# Patient Record
Sex: Male | Born: 1945
Health system: Southern US, Community
[De-identification: ages and names within clinical notes are randomized; demographics above are authoritative.]

## PROBLEM LIST (undated history)

## (undated) DIAGNOSIS — R972 Elevated prostate specific antigen [PSA]: Secondary | ICD-10-CM

## (undated) DIAGNOSIS — N400 Enlarged prostate without lower urinary tract symptoms: Secondary | ICD-10-CM

## (undated) DIAGNOSIS — N401 Enlarged prostate with lower urinary tract symptoms: Secondary | ICD-10-CM

## (undated) HISTORY — PX: HERNIA REPAIR: SHX51

## (undated) HISTORY — DX: Benign prostatic hyperplasia with lower urinary tract symptoms: N40.1

## (undated) HISTORY — DX: Benign prostatic hyperplasia without lower urinary tract symptoms: N40.0

## (undated) HISTORY — DX: Benign prostatic hyperplasia without lower urinary tract symptoms: R97.20

---

## 1999-09-15 ENCOUNTER — Encounter: Payer: Self-pay | Admitting: General Surgery

## 1999-09-17 ENCOUNTER — Ambulatory Visit (HOSPITAL_COMMUNITY): Admission: RE | Admit: 1999-09-17 | Discharge: 1999-09-17 | Payer: Self-pay | Admitting: Urology

## 2008-09-26 ENCOUNTER — Emergency Department (HOSPITAL_COMMUNITY): Admission: EM | Admit: 2008-09-26 | Discharge: 2008-09-26 | Payer: Self-pay | Admitting: Emergency Medicine

## 2009-03-27 ENCOUNTER — Ambulatory Visit (HOSPITAL_BASED_OUTPATIENT_CLINIC_OR_DEPARTMENT_OTHER): Admission: RE | Admit: 2009-03-27 | Discharge: 2009-03-27 | Payer: Self-pay | Admitting: Surgery

## 2011-01-03 LAB — POCT HEMOGLOBIN-HEMACUE: Hemoglobin: 14.3 g/dL (ref 13.0–17.0)

## 2011-02-09 NOTE — Op Note (Signed)
NAME:  Andrew Hurst, Andrew Hurst                  ACCOUNT NO.:  [id number]   MEDICAL RECORD NO.:  [id number]          PATIENT TYPE:  AMB   LOCATION:  DSC                          FACILITY:  [facility]   PHYSICIAN:  Currie Paris, M.D.DATE OF BIRTH:  [birth year]   DATE OF PROCEDURE:  DATE OF DISCHARGE:                               OPERATIVE REPORT   PREOPERATIVE DIAGNOSIS:  Left inguinal hernia.   POSTOPERATIVE DIAGNOSIS:  Left inguinal hernia - direct.   OPERATION:  Repair of direct left inguinal hernia with mesh.   SURGEON:  Currie Paris, MD   ANESTHESIA:  General.   CLINICAL HISTORY:  This is a 65 year old gentleman with a fairly large  left inguinal hernia that he desired to have repaired.  He has had a  previous right inguinal hernia repair several years ago.   DESCRIPTION OF PROCEDURE:  The patient was seen in the holding area and  he had no further questions.  We had both identified and marked the left  side as the operative side.   The patient was taken to the operating room and after satisfactory  general anesthesia had been obtained, the left inguinal area was  clipped, prepped, and draped.  The time-out was done.   To help with postoperative pain, I injected 0.25% plain Marcaine with  epi along the skin and subcutaneous tissues of the incision and closed  the anterior superior iliac spine below the fascia.   The incision was made and deepened to the external oblique aponeurosis.  The superficial ring was quite dilated.  The fascia was opened and the  cord structures dissected off the undersurface of the fascia and then  lifted up off of the inguinal floor.  There was a large direct defect  with a large amount of preperitoneal fat protruding through and stuck to  the bottom of the cord, which I was able to remove.  Some small vessels  coming out off the epigastric were ligated.  Identified the vas and the  testicular vessels in the cord and preserved those.  I inspected  the  cord closely and there was no evidence of any indirect sac.   Once I had everything cleaned off, I put some 2-0 Prolene to  reapproximate the transversalis and keep the direct defect reduce, so I  could put some mesh in.  I also made sure in doing so that we had a  fairly snug deep ring.   A piece of Marlex mesh was then taped cut, so it was tapered medially  and split laterally to go around the cord and sutured in with a running  2-0 Prolene inferiorly and tacked well up onto the internal oblique  aponeurosis superiorly.  The mesh had been split and was wrapped around  the cord and tacked laterally.  I did not definitively identify the  ilioinguinal nerve, but kept the cord structures together and thought  that it would be contained in those.   I put more Marcaine as we worked.  Everything appeared to be dry, so I  closed with 3-0 Vicryl on  the external oblique and Scarpa's, and for  Monocryl subcuticular and Dermabond on the skin.   The patient tolerated the procedure well and there were no  complications.      Currie Paris, M.D.  Electronically Signed     CJS/MEDQ  D:  03/27/2009  T:  03/28/2009  Job:  [id number]

## 2013-03-13 ENCOUNTER — Ambulatory Visit (INDEPENDENT_AMBULATORY_CARE_PROVIDER_SITE_OTHER): Payer: 59 | Admitting: Internal Medicine

## 2013-03-13 VITALS — BP 156/72 | HR 66 | Temp 98.0°F | Resp 16 | Ht 66.5 in | Wt 170.0 lb

## 2013-03-13 DIAGNOSIS — W57XXXA Bitten or stung by nonvenomous insect and other nonvenomous arthropods, initial encounter: Secondary | ICD-10-CM

## 2013-03-13 MED ORDER — DOXYCYCLINE HYCLATE 100 MG PO TABS: 100.0000 mg | ORAL_TABLET | Freq: Two times a day (BID) | ORAL | Status: DC

## 2013-03-13 NOTE — Progress Notes (Signed)
  Subjective:    Patient ID: Andrew Hurst, male    DOB: [birth year], 67 y.o.   MRN: [id number]  HPI2 ticks removed L buttock 1 week ago Red spots/sl itch No systemic sxtoms  No illn No meds  No real F/U-ongoing care    Review of Systems     Objective:   Physical Exam BP 156/72  Pulse 66  Temp(Src) 98 F (36.7 C) (Oral)  Resp 16  Ht 5' 6.5" (1.689 m)  Wt 170 lb (77.111 kg)  BMI 27.03 kg/m2  SpO2 96% 2 cm lesions x 2 on L buttock No pus or vesic No eryth marg        Assessment & Plan:  Tick bite Given doxy to hold and start if any fever/flu sxt, then f/u asap  Set up appt for CPE-routine care

## 2013-05-31 ENCOUNTER — Ambulatory Visit: Payer: 59

## 2013-05-31 ENCOUNTER — Ambulatory Visit (INDEPENDENT_AMBULATORY_CARE_PROVIDER_SITE_OTHER): Payer: 59 | Admitting: Emergency Medicine

## 2013-05-31 VITALS — BP 114/64 | HR 72 | Temp 97.9°F | Resp 18 | Ht 66.5 in | Wt 196.4 lb

## 2013-05-31 DIAGNOSIS — J020 Streptococcal pharyngitis: Secondary | ICD-10-CM

## 2013-05-31 DIAGNOSIS — R059 Cough, unspecified: Secondary | ICD-10-CM

## 2013-05-31 DIAGNOSIS — R05 Cough: Secondary | ICD-10-CM

## 2013-05-31 DIAGNOSIS — J209 Acute bronchitis, unspecified: Secondary | ICD-10-CM

## 2013-05-31 LAB — POCT CBC
HCT, POC: 42.7 % — AB (ref 43.5–53.7)
Hemoglobin: 13.7 g/dL — AB (ref 14.1–18.1)
Lymph, poc: 2.1 (ref 0.6–3.4)
MCH, POC: 31.7 pg — AB (ref 27–31.2)
MCHC: 32.1 g/dL (ref 31.8–35.4)
MCV: 98.9 fL — AB (ref 80–97)
MPV: 7.6 fL (ref 0–99.8)
POC MID %: 7.7 %M (ref 0–12)
RBC: 4.32 M/uL — AB (ref 4.69–6.13)
WBC: 7.8 10*3/uL (ref 4.6–10.2)

## 2013-05-31 LAB — POCT RAPID STREP A (OFFICE): Rapid Strep A Screen: NEGATIVE

## 2013-05-31 MED ORDER — HYDROCODONE-HOMATROPINE 5-1.5 MG/5ML PO SYRP: ORAL_SOLUTION | ORAL | Status: DC

## 2013-05-31 MED ORDER — AZITHROMYCIN 250 MG PO TABS: ORAL_TABLET | ORAL | Status: DC

## 2013-05-31 MED ORDER — BENZONATATE 100 MG PO CAPS: 100.0000 mg | ORAL_CAPSULE | Freq: Three times a day (TID) | ORAL | Status: DC | PRN

## 2013-05-31 NOTE — Progress Notes (Signed)
  Subjective:    Patient ID: Andrew Hurst, male    DOB: Mar 02, 1946, 67 y.o.   MRN: [id number]  HPI patient states he got ill 1 week ago. He started with head congestion scratchy throat hoarseness. He then developed chest tightness and a full sensation in his head. He denies fever he denies productive cough. He has a history of no ongoing medical problems is in good health and is nonsmoker.    Review of Systems     Objective:   Physical Exam HEENT exam TMs are clear nose is congested the throat is slightly red the uvula is somewhat edematous. The neck is supple chest is clear to both auscultation and percussion without wheezes cardiac exam reveals a regular rate without murmurs.  [facility] reading (PRIMARY) by  Dr. Cleta Alberts their increased bronchial markings in the left base and in the right middle lobe seen best on the lateral.  Results for orders placed in visit on 05/31/13  POCT RAPID STREP A (OFFICE)      Result Value Range   Rapid Strep A Screen Negative  Negative  POCT CBC      Result Value Range   WBC 7.8  4.6 - 10.2 K/uL   Lymph, poc 2.1  0.6 - 3.4   POC LYMPH PERCENT 26.4  10 - 50 %L   MID (cbc) 0.6  0 - 0.9   POC MID % 7.7  0 - 12 %M   POC Granulocyte 5.1  2 - 6.9   Granulocyte percent 65.9  37 - 80 %G   RBC 4.32 (*) 4.69 - 6.13 M/uL   Hemoglobin 13.7 (*) 14.1 - 18.1 g/dL   HCT, POC 40.9 (*) 81.1 - 53.7 %   MCV 98.9 (*) 80 - 97 fL   MCH, POC 31.7 (*) 27 - 31.2 pg   MCHC 32.1  31.8 - 35.4 g/dL   RDW, POC 91.4     Platelet Count, POC 257  142 - 424 K/uL   MPV 7.6  0 - 99.8 fL        Assessment & Plan:  Increased bronchial markings seen on chest x-ray. White count is normal strep test negative will treat with Tessalon Perles cough syrup at night and a Z-Pak.

## 2013-05-31 NOTE — Patient Instructions (Signed)

## 2013-07-17 ENCOUNTER — Encounter: Payer: Self-pay | Admitting: Physician Assistant

## 2013-07-17 ENCOUNTER — Ambulatory Visit (INDEPENDENT_AMBULATORY_CARE_PROVIDER_SITE_OTHER): Payer: 59 | Admitting: Physician Assistant

## 2013-07-17 VITALS — BP 140/70 | HR 56 | Temp 97.8°F | Resp 16 | Ht 66.0 in | Wt 171.4 lb

## 2013-07-17 DIAGNOSIS — R059 Cough, unspecified: Secondary | ICD-10-CM

## 2013-07-17 DIAGNOSIS — Z1211 Encounter for screening for malignant neoplasm of colon: Secondary | ICD-10-CM

## 2013-07-17 DIAGNOSIS — Z1159 Encounter for screening for other viral diseases: Secondary | ICD-10-CM

## 2013-07-17 DIAGNOSIS — Z23 Encounter for immunization: Secondary | ICD-10-CM

## 2013-07-17 DIAGNOSIS — Z0000 Encounter for general adult medical examination without abnormal findings: Secondary | ICD-10-CM

## 2013-07-17 DIAGNOSIS — R05 Cough: Secondary | ICD-10-CM

## 2013-07-17 DIAGNOSIS — Z125 Encounter for screening for malignant neoplasm of prostate: Secondary | ICD-10-CM

## 2013-07-17 LAB — POCT URINALYSIS DIPSTICK
Bilirubin, UA: NEGATIVE
Blood, UA: NEGATIVE
Glucose, UA: NEGATIVE
Ketones, UA: NEGATIVE
Leukocytes, UA: NEGATIVE
Protein, UA: NEGATIVE
Spec Grav, UA: 1.015
Urobilinogen, UA: 0.2

## 2013-07-17 LAB — CBC WITH DIFFERENTIAL/PLATELET
Basophils Relative: 1 % (ref 0–1)
Eosinophils Relative: 4 % (ref 0–5)
HCT: 42.2 % (ref 39.0–52.0)
Hemoglobin: 14.7 g/dL (ref 13.0–17.0)
MCHC: 34.8 g/dL (ref 30.0–36.0)
MCV: 90.2 fL (ref 78.0–100.0)
Monocytes Absolute: 0.5 10*3/uL (ref 0.1–1.0)
Monocytes Relative: 9 % (ref 3–12)
Neutro Abs: 3 10*3/uL (ref 1.7–7.7)
RDW: 13.4 % (ref 11.5–15.5)

## 2013-07-17 LAB — POCT UA - MICROSCOPIC ONLY
Bacteria, U Microscopic: NEGATIVE
Casts, Ur, LPF, POC: NEGATIVE
Crystals, Ur, HPF, POC: NEGATIVE
Mucus, UA: NEGATIVE

## 2013-07-17 LAB — LIPID PANEL
HDL: 44 mg/dL (ref >39)
Total CHOL/HDL Ratio: 3.9 Ratio
Triglycerides: 157 mg/dL — ABNORMAL HIGH (ref <150)

## 2013-07-17 LAB — COMPREHENSIVE METABOLIC PANEL
Albumin: 3.9 g/dL (ref 3.5–5.2)
Alkaline Phosphatase: 51 U/L (ref 39–117)
BUN: 16 mg/dL (ref 6–23)
Calcium: 8.8 mg/dL (ref 8.4–10.5)
Chloride: 106 mEq/L (ref 96–112)
Creat: 1.05 mg/dL (ref 0.50–1.35)
Glucose, Bld: 112 mg/dL — ABNORMAL HIGH (ref 70–99)
Potassium: 4 mEq/L (ref 3.5–5.3)

## 2013-07-17 LAB — IFOBT (OCCULT BLOOD): IFOBT: NEGATIVE

## 2013-07-17 LAB — HEPATITIS C ANTIBODY: HCV Ab: NEGATIVE

## 2013-07-17 MED ORDER — ZOSTER VACCINE LIVE 19400 UNT/0.65ML ~~LOC~~ SOLR: 0.6500 mL | Freq: Once | SUBCUTANEOUS | Status: DC

## 2013-07-17 MED ORDER — IPRATROPIUM BROMIDE 0.03 % NA SOLN: 2.0000 | Freq: Two times a day (BID) | NASAL | Status: DC

## 2013-07-17 NOTE — Progress Notes (Signed)
Subjective:    Patient ID: Andrew Hurst, male    DOB: 1945/10/11, 67 y.o.   MRN: [id number]  HPI  This 67 y.o. male presents for Annual Wellness Exam.    Active Ambulatory Problems    Diagnosis Date Noted  . No Active Ambulatory Problems   Resolved Ambulatory Problems    Diagnosis Date Noted  . No Resolved Ambulatory Problems   No Additional Past Medical History    Past Surgical History  Procedure Laterality Date  . Hernia repair      No Known Allergies  Prior to Admission medications   Medication Sig Start Date End Date Taking? Authorizing Provider  ipratropium (ATROVENT) 0.03 % nasal spray Place 2 sprays into the nose 2 (two) times daily. 07/17/13   Tenea Sens S Glynn Freas, PA-C  zoster vaccine live, PF, (ZOSTAVAX) 40981 UNT/0.65ML injection Inject 19,400 Units into the skin once. 07/17/13   Fernande Bras, PA-C    History   Social History  . Marital Status: Widowed    Spouse Name: n/a    Number of Children: 2  . Years of Education: N/A   Occupational History  .  [employer]   Social History Main Topics  . Smoking status: Never Smoker   . Smokeless tobacco: Never Used  . Alcohol Use: No  . Drug Use: No  . Sexual Activity: Yes    Partners: Female    Birth Control/ Protection: Condom     Comment: "not that much"   Other Topics Concern  . None   Social History Narrative   Widowed. Wife died in 61 of metastatic breast cancer at age 69 years. Education: college. Pt. Does exercise.    family history includes COPD in his father; Cancer (age of onset: 88) in his brother; Kidney disease in his mother. indicated that his mother is deceased. He indicated that his father is deceased. He indicated that his brother is deceased. He indicated that his maternal grandmother is deceased. He indicated that his maternal grandfather is deceased. He indicated that his paternal grandmother is deceased. He indicated that his paternal grandfather is deceased. He indicated  that both of his sons are alive.   Review of Systems  Constitutional: Negative.   HENT: Negative.   Eyes: Negative.   Respiratory: Positive for cough (x 4-6 weeks, post-nasal draiange.  Coworkers with similar prolonged cough after URI-type illness.).   Cardiovascular: Negative.   Gastrointestinal: Negative.   Endocrine: Negative.   Genitourinary: Negative.   Musculoskeletal: Negative.   Skin: Negative.   Allergic/Immunologic: Negative.   Neurological: Negative.   Hematological: Negative.   Psychiatric/Behavioral: Negative.        Objective:   Physical Exam  Vitals reviewed. Constitutional: He is oriented to person, place, and time. Vital signs are normal. He appears well-developed and well-nourished. He is active and cooperative.  Non-toxic appearance. He does not have a sickly appearance. He does not appear ill. No distress.  HENT:  Head: Normocephalic and atraumatic.  Right Ear: Hearing, tympanic membrane, external ear and ear canal normal.  Left Ear: Hearing, tympanic membrane, external ear and ear canal normal.  Nose: Nose normal.  Mouth/Throat: Uvula is midline, oropharynx is clear and moist and mucous membranes are normal. He does not have dentures. No oral lesions. No trismus in the jaw. Normal dentition. No dental abscesses, uvula swelling, lacerations or dental caries.  Eyes: Conjunctivae, EOM and lids are normal. Pupils are equal, round, and reactive to light. Right eye exhibits no discharge. Left  eye exhibits no discharge. No scleral icterus.  Fundoscopic exam:      The right eye shows no arteriolar narrowing, no AV nicking, no exudate, no hemorrhage and no papilledema.       The left eye shows no arteriolar narrowing, no AV nicking, no exudate, no hemorrhage and no papilledema.  Neck: Normal range of motion, full passive range of motion without pain and phonation normal. Neck supple. No spinous process tenderness and no muscular tenderness present. No rigidity. No  tracheal deviation, no edema, no erythema and normal range of motion present. No thyromegaly present.  Cardiovascular: Normal rate, regular rhythm, S1 normal, S2 normal, normal heart sounds, intact distal pulses and normal pulses.  Exam reveals no gallop and no friction rub.   No murmur heard. Pulmonary/Chest: Effort normal and breath sounds normal. No respiratory distress. He has no wheezes. He has no rales.  Abdominal: Soft. Normal appearance and bowel sounds are normal. He exhibits no distension and no mass. There is no hepatosplenomegaly. There is no tenderness. There is no rebound and no guarding. No hernia. Hernia confirmed negative in the right inguinal area and confirmed negative in the left inguinal area.  Genitourinary: Rectum normal, prostate normal, testes normal and penis normal. Guaiac negative stool. Circumcised. No phimosis, paraphimosis, hypospadias, penile erythema or penile tenderness. No discharge found.  Musculoskeletal: Normal range of motion. He exhibits no edema and no tenderness.       Right shoulder: Normal.       Left shoulder: Normal.       Right elbow: Normal.      Left elbow: Normal.       Right wrist: Normal.       Left wrist: Normal.       Right hip: Normal.       Left hip: Normal.       Right knee: Normal.       Left knee: Normal.       Right ankle: Normal. Achilles tendon normal.       Left ankle: Normal. Achilles tendon normal.       Cervical back: Normal. He exhibits normal range of motion, no tenderness, no bony tenderness, no swelling, no edema, no deformity, no laceration, no pain, no spasm and normal pulse.       Thoracic back: Normal.       Lumbar back: Normal.       Right upper arm: Normal.       Left upper arm: Normal.       Right forearm: Normal.       Left forearm: Normal.       Right hand: Normal.       Left hand: Normal.       Right upper leg: Normal.       Left upper leg: Normal.       Right lower leg: Normal.       Left lower leg:  Normal.       Right foot: Normal.       Left foot: Normal.  Lymphadenopathy:       Head (right side): No submental, no submandibular, no tonsillar, no preauricular, no posterior auricular and no occipital adenopathy present.       Head (left side): No submental, no submandibular, no tonsillar, no preauricular, no posterior auricular and no occipital adenopathy present.    He has no cervical adenopathy.       Right: No inguinal and no supraclavicular adenopathy present.  Left: No inguinal and no supraclavicular adenopathy present.  Neurological: He is alert and oriented to person, place, and time. He has normal strength and normal reflexes. He displays no tremor. No cranial nerve deficit. He exhibits normal muscle tone. Coordination and gait normal.  Skin: Skin is warm, dry and intact. No abrasion, no ecchymosis, no laceration, no lesion and no rash noted. He is not diaphoretic. No cyanosis or erythema. No pallor. Nails show no clubbing.  Psychiatric: He has a normal mood and affect. His speech is normal and behavior is normal. Judgment and thought content normal. Cognition and memory are normal.   Results for orders placed in visit on 07/17/13  POCT UA - MICROSCOPIC ONLY      Result Value Range   WBC, Ur, HPF, POC 0     RBC, urine, microscopic 0     Bacteria, U Microscopic neg     Mucus, UA neg     Epithelial cells, urine per micros 0     Crystals, Ur, HPF, POC neg     Casts, Ur, LPF, POC neg     Yeast, UA neg    POCT URINALYSIS DIPSTICK      Result Value Range   Color, UA yellow     Clarity, UA clear     Glucose, UA neg     Bilirubin, UA neg     Ketones, UA nege     Spec Grav, UA 1.015     Blood, UA neg     pH, UA 7.0     Protein, UA neg     Urobilinogen, UA 0.2     Nitrite, UA neg     Leukocytes, UA Negative    IFOBT (OCCULT BLOOD)      Result Value Range   IFOBT Negative            Assessment & Plan:  Routine general medical examination at a health care  facility - Plan: CBC with Differential, Comprehensive metabolic panel, Lipid panel, TSH, POCT UA - Microscopic Only, POCT urinalysis dipstick; Age appropriate anticipatory guidance provided.  Screening for colon cancer - Plan: IFOBT POC (occult bld, rslt in office). Colonoscopy current.  Screening for prostate cancer - Plan: PSA  Cough - suspect persistent post-nasal drainage as cause.  If persists, re-evaluate. Plan: ipratropium (ATROVENT) 0.03 % nasal spray  Need for hepatitis C screening test - Plan: Hepatitis C antibody  Need for influenza vaccination - Plan: CANCELED: Flu Vaccine QUAD 36+ mos IM; patient declined vaccination here and indicates he will receive it at work.  Need for pneumococcal vaccination - Plan: Pneumococcal polysaccharide vaccine 23-valent greater than or equal to 2yo subcutaneous/IM  Need for shingles vaccine - Plan: zoster vaccine live, PF, (ZOSTAVAX) 16109 UNT/0.65ML injection  Fernande Bras, PA-C Physician Assistant-Certified [facility]

## 2013-07-17 NOTE — Patient Instructions (Signed)

## 2013-07-19 ENCOUNTER — Encounter: Payer: Self-pay | Admitting: Physician Assistant

## 2013-10-12 ENCOUNTER — Ambulatory Visit (INDEPENDENT_AMBULATORY_CARE_PROVIDER_SITE_OTHER): Payer: 59 | Admitting: Internal Medicine

## 2013-10-12 ENCOUNTER — Ambulatory Visit: Payer: 59

## 2013-10-12 VITALS — BP 128/62 | HR 74 | Temp 97.4°F | Resp 18 | Ht 67.0 in | Wt 176.0 lb

## 2013-10-12 DIAGNOSIS — R05 Cough: Secondary | ICD-10-CM

## 2013-10-12 DIAGNOSIS — J329 Chronic sinusitis, unspecified: Secondary | ICD-10-CM

## 2013-10-12 DIAGNOSIS — R972 Elevated prostate specific antigen [PSA]: Secondary | ICD-10-CM

## 2013-10-12 DIAGNOSIS — R5383 Other fatigue: Secondary | ICD-10-CM

## 2013-10-12 DIAGNOSIS — R059 Cough, unspecified: Secondary | ICD-10-CM

## 2013-10-12 DIAGNOSIS — R5381 Other malaise: Secondary | ICD-10-CM

## 2013-10-12 LAB — POCT CBC
GRANULOCYTE PERCENT: 66.2 % (ref 37–80)
HCT, POC: 47.8 % (ref 43.5–53.7)
Hemoglobin: 15.1 g/dL (ref 14.1–18.1)
LYMPH, POC: 1.8 (ref 0.6–3.4)
MCH, POC: 31 pg (ref 27–31.2)
MCHC: 31.6 g/dL — AB (ref 31.8–35.4)
MCV: 98.1 fL — AB (ref 80–97)
MID (cbc): 0.7 (ref 0–0.9)
MPV: 7.7 fL (ref 0–99.8)
PLATELET COUNT, POC: 215 10*3/uL (ref 142–424)
POC GRANULOCYTE: 4.8 (ref 2–6.9)
POC LYMPH %: 24.5 % (ref 10–50)
POC MID %: 9.3 %M (ref 0–12)
RBC: 4.87 M/uL (ref 4.69–6.13)
RDW, POC: 13.9 %
WBC: 7.3 10*3/uL (ref 4.6–10.2)

## 2013-10-12 LAB — PSA: PSA: 6.65 ng/mL — AB (ref <=4.00)

## 2013-10-12 MED ORDER — AZITHROMYCIN 500 MG PO TABS: 500.0000 mg | ORAL_TABLET | Freq: Every day | ORAL | Status: DC

## 2013-10-12 MED ORDER — HYDROCODONE-ACETAMINOPHEN 7.5-325 MG/15ML PO SOLN: 10.0000 mL | Freq: Four times a day (QID) | ORAL | Status: DC | PRN

## 2013-10-12 NOTE — Progress Notes (Signed)
   Subjective:    Patient ID: Andrew Hurst, male    DOB: [birth year], 68 y.o.   MRN: [id number]  HPI    Review of Systems     Objective:   Physical Exam        Assessment & Plan:

## 2013-10-12 NOTE — Patient Instructions (Addendum)
Sinusitis Sinusitis is redness, soreness, and swelling (inflammation) of the paranasal sinuses. Paranasal sinuses are air pockets within the bones of your face (beneath the eyes, the middle of the forehead, or above the eyes). In healthy paranasal sinuses, mucus is able to drain out, and air is able to circulate through them by way of your nose. However, when your paranasal sinuses are inflamed, mucus and air can become trapped. This can allow bacteria and other germs to grow and cause infection. Sinusitis can develop quickly and last only a short time (acute) or continue over a long period (chronic). Sinusitis that lasts for more than 12 weeks is considered chronic.  CAUSES  Causes of sinusitis include:  Allergies.  Structural abnormalities, such as displacement of the cartilage that separates your nostrils (deviated septum), which can decrease the air flow through your nose and sinuses and affect sinus drainage.  Functional abnormalities, such as when the small hairs (cilia) that line your sinuses and help remove mucus do not work properly or are not present. SYMPTOMS  Symptoms of acute and chronic sinusitis are the same. The primary symptoms are pain and pressure around the affected sinuses. Other symptoms include:  Upper toothache.  Earache.  Headache.  Bad breath.  Decreased sense of smell and taste.  A cough, which worsens when you are lying flat.  Fatigue.  Fever.  Thick drainage from your nose, which often is green and may contain pus (purulent).  Swelling and warmth over the affected sinuses. DIAGNOSIS  Your caregiver will perform a physical exam. During the exam, your caregiver may:  Look in your nose for signs of abnormal growths in your nostrils (nasal polyps).  Tap over the affected sinus to check for signs of infection.  View the inside of your sinuses (endoscopy) with a special imaging device with a light attached (endoscope), which is inserted into your  sinuses. If your caregiver suspects that you have chronic sinusitis, one or more of the following tests may be recommended:  Allergy tests.  Nasal culture A sample of mucus is taken from your nose and sent to a lab and screened for bacteria.  Nasal cytology A sample of mucus is taken from your nose and examined by your caregiver to determine if your sinusitis is related to an allergy. TREATMENT  Most cases of acute sinusitis are related to a viral infection and will resolve on their own within 10 days. Sometimes medicines are prescribed to help relieve symptoms (pain medicine, decongestants, nasal steroid sprays, or saline sprays).  However, for sinusitis related to a bacterial infection, your caregiver will prescribe antibiotic medicines. These are medicines that will help kill the bacteria causing the infection.  Rarely, sinusitis is caused by a fungal infection. In theses cases, your caregiver will prescribe antifungal medicine. For some cases of chronic sinusitis, surgery is needed. Generally, these are cases in which sinusitis recurs more than 3 times per year, despite other treatments. HOME CARE INSTRUCTIONS   Drink plenty of water. Water helps thin the mucus so your sinuses can drain more easily.  Use a humidifier.  Inhale steam 3 to 4 times a day (for example, sit in the bathroom with the shower running).  Apply a warm, moist washcloth to your face 3 to 4 times a day, or as directed by your caregiver.  Use saline nasal sprays to help moisten and clean your sinuses.  Take over-the-counter or prescription medicines for pain, discomfort, or fever only as directed by your caregiver. SEEK IMMEDIATE MEDICAL   CARE IF:  You have increasing pain or severe headaches.  You have nausea, vomiting, or drowsiness.  You have swelling around your face.  You have vision problems.  You have a stiff neck.  You have difficulty breathing. MAKE SURE YOU:   Understand these  instructions.  Will watch your condition.  Will get help right away if you are not doing well or get worse. Document Released: 09/13/2005 Document Revised: 12/06/2011 Document Reviewed: 09/28/2011 [facility] Patient Information 2014 [place], [place]. Prostate-Specific Antigen The prostate-specific antigen (PSA) is a blood test. It is used to help detect early forms of prostate cancer. The test is usually used along with other tests. The test is also used to follow the course of those who already have prostate cancer or who have been treated for prostate cancer. Some factors interfere with the results of the PSA. The factors listed below will either increase or decrease the PSA levels. They are:  Prescriptions used for male baldness.  Some herbs.  Active prostate infection.  Prior instrumentation or urinary catheterization.  Ejaculation up to 2 days prior to testing.  A noncancerous enlargement of the prostate.  Inflammation of the prostate.  Active urinary tract infection. If your test results are elevated, your caregiver will discuss the results with you. Your caregiver will also let you know if more evaluation is needed. PREPARATION FOR TEST No preparation or fasting is necessary. NORMAL FINDINGS Less than 4 ng/mL or Less than 404mcg/L (SI units) Ranges for normal findings may vary among different laboratories and hospitals. You should always check with your caregiver after having lab work or other tests done to discuss the meaning of your test results and whether your values are considered within normal limits. MEANING OF TEST  A normal value means prostate cancer is less likely. The chance of having prostate cancer increases if the value is between 4 ng/mL and 10 ng/mL. However, further testing will be needed. Values above 10 ng/mL indicate that there is a much higher chance of having prostate cancer (if the above situations that raise PSA are not present). Your caregiver will go  over your test results with you and discuss the importance of this test. If this value is elevated, your caregiver may recommend further testing or evaluation. OBTAINING THE TEST RESULTS It is your responsibility to obtain your test results. Ask the lab or department performing the test when and how you will get your results. Document Released: 10/16/2004 Document Revised: 12/06/2011 Document Reviewed: 04/21/2007 [facility] Patient Information 2014 [place], [place].

## 2013-10-12 NOTE — Progress Notes (Signed)
   Subjective:    Patient ID: Andrew Hurst, male    DOB: 07-16-1946, 68 y.o.   MRN: [id number]  HPI Patient presents with a three day history of slightly productive cough sinus pressure and sinus congestion some pressure around the eyes.  Patient denies fever, sore throat, nausea, vomiting, or diarrhea.  Elevated PSA 5.38 found on chart review. Review of Systems     Objective:   Physical Exam  Vitals reviewed. Constitutional: He is oriented to person, place, and time. He appears well-developed and well-nourished. No distress.  HENT:  Head: Normocephalic.  Right Ear: External ear normal.  Left Ear: External ear normal.  Nose: Mucosal edema, rhinorrhea and sinus tenderness present. No epistaxis. Right sinus exhibits maxillary sinus tenderness and frontal sinus tenderness. Left sinus exhibits maxillary sinus tenderness and frontal sinus tenderness.  Mouth/Throat: Oropharynx is clear and moist.  Eyes: Conjunctivae and EOM are normal. Pupils are equal, round, and reactive to light.  Neck: Normal range of motion. Neck supple. No tracheal deviation present. No thyromegaly present.  Cardiovascular: Normal rate, regular rhythm and normal heart sounds.   Pulmonary/Chest: Bradypnea noted. No respiratory distress. He has no decreased breath sounds. He has wheezes. He has rhonchi. He has no rales.  Lymphadenopathy:    He has no cervical adenopathy.  Neurological: He is alert and oriented to person, place, and time. He exhibits normal muscle tone. Coordination normal.  Psychiatric: He has a normal mood and affect.    [facility] reading (PRIMARY) by  Dr.Oretha Weismann. Possible left lower lobe infiltrate   Results for orders placed in visit on 10/12/13  POCT CBC      Result Value Range   WBC 7.3  4.6 - 10.2 K/uL   Lymph, poc 1.8  0.6 - 3.4   POC LYMPH PERCENT 24.5  10 - 50 %L   MID (cbc) 0.7  0 - 0.9   POC MID % 9.3  0 - 12 %M   POC Granulocyte 4.8  2 - 6.9   Granulocyte percent 66.2  37 - 80 %G   RBC 4.87  4.69 - 6.13 M/uL   Hemoglobin 15.1  14.1 - 18.1 g/dL   HCT, POC 62.947.8  52.843.5 - 53.7 %   MCV 98.1 (*) 80 - 97 fL   MCH, POC 31.0  27 - 31.2 pg   MCHC 31.6 (*) 31.8 - 35.4 g/dL   RDW, POC 41.313.9     Platelet Count, POC 215  142 - 424 K/uL   MPV 7.7  0 - 99.8 fL        Assessment & Plan:  Sinusitis/Cough/Bronchitis High PSA from 07/19/13 cpe/Repeat PSA

## 2013-10-29 DIAGNOSIS — R972 Elevated prostate specific antigen [PSA]: Secondary | ICD-10-CM | POA: Insufficient documentation

## 2014-03-19 ENCOUNTER — Ambulatory Visit (INDEPENDENT_AMBULATORY_CARE_PROVIDER_SITE_OTHER): Payer: 59 | Admitting: Family Medicine

## 2014-03-19 VITALS — BP 118/72 | HR 64 | Temp 98.2°F | Resp 18 | Ht 67.0 in | Wt 170.0 lb

## 2014-03-19 DIAGNOSIS — M25579 Pain in unspecified ankle and joints of unspecified foot: Secondary | ICD-10-CM

## 2014-03-19 DIAGNOSIS — M25572 Pain in left ankle and joints of left foot: Secondary | ICD-10-CM

## 2014-03-19 DIAGNOSIS — H9190 Unspecified hearing loss, unspecified ear: Secondary | ICD-10-CM

## 2014-03-19 DIAGNOSIS — M25571 Pain in right ankle and joints of right foot: Secondary | ICD-10-CM

## 2014-03-19 DIAGNOSIS — H9192 Unspecified hearing loss, left ear: Secondary | ICD-10-CM

## 2014-03-19 DIAGNOSIS — M722 Plantar fascial fibromatosis: Secondary | ICD-10-CM

## 2014-03-19 DIAGNOSIS — M25569 Pain in unspecified knee: Secondary | ICD-10-CM

## 2014-03-19 DIAGNOSIS — M25561 Pain in right knee: Secondary | ICD-10-CM

## 2014-03-19 NOTE — Patient Instructions (Signed)
Plantar Fasciitis (Heel Spur Syndrome) with Rehab The plantar fascia is a fibrous, ligament-like, soft-tissue structure that spans the bottom of the foot. Plantar fasciitis is a condition that causes pain in the foot due to inflammation of the tissue. SYMPTOMS   Pain and tenderness on the underneath side of the foot.  Pain that worsens with standing or walking. CAUSES  Plantar fasciitis is caused by irritation and injury to the plantar fascia on the underneath side of the foot. Common mechanisms of injury include:  Direct trauma to bottom of the foot.  Damage to a small nerve that runs under the foot where the main fascia attaches to the heel bone.  Stress placed on the plantar fascia due to bone spurs. RISK INCREASES WITH:   Activities that place stress on the plantar fascia (running, jumping, pivoting, or cutting).  Poor strength and flexibility.  Improperly fitted shoes.  Tight calf muscles.  Flat feet.  Failure to warm-up properly before activity.  Obesity. PREVENTION  Warm up and stretch properly before activity.  Allow for adequate recovery between workouts.  Maintain physical fitness:  Strength, flexibility, and endurance.  Cardiovascular fitness.  Maintain a health body weight.  Avoid stress on the plantar fascia.  Wear properly fitted shoes, including arch supports for individuals who have flat feet. PROGNOSIS  If treated properly, then the symptoms of plantar fasciitis usually resolve without surgery. However, occasionally surgery is necessary. RELATED COMPLICATIONS   Recurrent symptoms that may result in a chronic condition.  Problems of the lower back that are caused by compensating for the injury, such as limping.  Pain or weakness of the foot during push-off following surgery.  Chronic inflammation, scarring, and partial or complete fascia tear, occurring more often from repeated injections. TREATMENT  Treatment initially involves the use of  ice and medication to help reduce pain and inflammation. The use of strengthening and stretching exercises may help reduce pain with activity, especially stretches of the Achilles tendon. These exercises may be performed at home or with a therapist. Your caregiver may recommend that you use heel cups of arch supports to help reduce stress on the plantar fascia. Occasionally, corticosteroid injections are given to reduce inflammation. If symptoms persist for greater than 6 months despite non-surgical (conservative), then surgery may be recommended.  MEDICATION   If pain medication is necessary, then nonsteroidal anti-inflammatory medications, such as aspirin and ibuprofen, or other minor pain relievers, such as acetaminophen, are often recommended.  Do not take pain medication within 7 days before surgery.  Prescription pain relievers may be given if deemed necessary by your caregiver. Use only as directed and only as much as you need.  Corticosteroid injections may be given by your caregiver. These injections should be reserved for the most serious cases, because they may only be given a certain number of times. HEAT AND COLD  Cold treatment (icing) relieves pain and reduces inflammation. Cold treatment should be applied for 10 to 15 minutes every 2 to 3 hours for inflammation and pain and immediately after any activity that aggravates your symptoms. Use ice packs or massage the area with a piece of ice (ice massage).  Heat treatment may be used prior to performing the stretching and strengthening activities prescribed by your caregiver, physical therapist, or athletic trainer. Use a heat pack or soak the injury in warm water. SEEK IMMEDIATE MEDICAL CARE IF:  Treatment seems to offer no benefit, or the condition worsens.  Any medications produce adverse side effects. EXERCISES RANGE   OF MOTION (ROM) AND STRETCHING EXERCISES - Plantar Fasciitis (Heel Spur Syndrome) These exercises may help you  when beginning to rehabilitate your injury. Your symptoms may resolve with or without further involvement from your physician, physical therapist or athletic trainer. While completing these exercises, remember:   Restoring tissue flexibility helps normal motion to return to the joints. This allows healthier, less painful movement and activity.  An effective stretch should be held for at least 30 seconds.  A stretch should never be painful. You should only feel a gentle lengthening or release in the stretched tissue. RANGE OF MOTION - Toe Extension, Flexion  Sit with your right / left leg crossed over your opposite knee.  Grasp your toes and gently pull them back toward the top of your foot. You should feel a stretch on the bottom of your toes and/or foot.  Hold this stretch for __________ seconds.  Now, gently pull your toes toward the bottom of your foot. You should feel a stretch on the top of your toes and or foot.  Hold this stretch for __________ seconds. Repeat __________ times. Complete this stretch __________ times per day.  RANGE OF MOTION - Ankle Dorsiflexion, Active Assisted  Remove shoes and sit on a chair that is preferably not on a carpeted surface.  Place right / left foot under knee. Extend your opposite leg for support.  Keeping your heel down, slide your right / left foot back toward the chair until you feel a stretch at your ankle or calf. If you do not feel a stretch, slide your bottom forward to the edge of the chair, while still keeping your heel down.  Hold this stretch for __________ seconds. Repeat __________ times. Complete this stretch __________ times per day.  STRETCH - Gastroc, Standing  Place hands on wall.  Extend right / left leg, keeping the front knee somewhat bent.  Slightly point your toes inward on your back foot.  Keeping your right / left heel on the floor and your knee straight, shift your weight toward the wall, not allowing your back to  arch.  You should feel a gentle stretch in the right / left calf. Hold this position for __________ seconds. Repeat __________ times. Complete this stretch __________ times per day. STRETCH - Soleus, Standing  Place hands on wall.  Extend right / left leg, keeping the other knee somewhat bent.  Slightly point your toes inward on your back foot.  Keep your right / left heel on the floor, bend your back knee, and slightly shift your weight over the back leg so that you feel a gentle stretch deep in your back calf.  Hold this position for __________ seconds. Repeat __________ times. Complete this stretch __________ times per day. STRETCH - Gastrocsoleus, Standing  Note: This exercise can place a lot of stress on your foot and ankle. Please complete this exercise only if specifically instructed by your caregiver.   Place the ball of your right / left foot on a step, keeping your other foot firmly on the same step.  Hold on to the wall or a rail for balance.  Slowly lift your other foot, allowing your body weight to press your heel down over the edge of the step.  You should feel a stretch in your right / left calf.  Hold this position for __________ seconds.  Repeat this exercise with a slight bend in your right / left knee. Repeat __________ times. Complete this stretch __________ times per day.    STRENGTHENING EXERCISES - Plantar Fasciitis (Heel Spur Syndrome)  These exercises may help you when beginning to rehabilitate your injury. They may resolve your symptoms with or without further involvement from your physician, physical therapist or athletic trainer. While completing these exercises, remember:   Muscles can gain both the endurance and the strength needed for everyday activities through controlled exercises.  Complete these exercises as instructed by your physician, physical therapist or athletic trainer. Progress the resistance and repetitions only as guided. STRENGTH -  Towel Curls  Sit in a chair positioned on a non-carpeted surface.  Place your foot on a towel, keeping your heel on the floor.  Pull the towel toward your heel by only curling your toes. Keep your heel on the floor.  If instructed by your physician, physical therapist or athletic trainer, add ____________________ at the end of the towel. Repeat __________ times. Complete this exercise __________ times per day. STRENGTH - Ankle Inversion  Secure one end of a rubber exercise band/tubing to a fixed object (table, pole). Loop the other end around your foot just before your toes.  Place your fists between your knees. This will focus your strengthening at your ankle.  Slowly, pull your big toe up and in, making sure the band/tubing is positioned to resist the entire motion.  Hold this position for __________ seconds.  Have your muscles resist the band/tubing as it slowly pulls your foot back to the starting position. Repeat __________ times. Complete this exercises __________ times per day.  Document Released: 09/13/2005 Document Revised: 12/06/2011 Document Reviewed: 12/26/2008 [employer] Patient Information 2015 [employer]. This information is not intended to replace advice given to you by your health care Muzammil Bruins. Make sure you discuss any questions you have with your health care Morning Halberg.  

## 2014-03-19 NOTE — Progress Notes (Signed)
Chief Complaint:  Chief Complaint  Patient presents with  . Foot Pain    bottom of left foot   . Leg Pain    rt-swelling after standing all day   . Hearing Loss    after shooting guns friday he did have ear plugs     HPI: Andrew Hurst is a 68 y.o. male who is here for : 1. Left foot/leg pain bilaterally x 1 year , starts at bottom of his feet at  Heel, he wears good shoes but worse as day goes on due to long hours on concrete floors, has not done anything for this 2. Hearing loss after he went to the shooting range 2 days ago, no prior problems with hearing, was wearing ear plugs  History reviewed. No pertinent past medical history. Past Surgical History  Procedure Laterality Date  . Hernia repair     History   Social History  . Marital Status: Widowed    Spouse Name: n/a    Number of Children: 2  . Years of Education: N/A   Occupational History  .  [employer]   Social History Main Topics  . Smoking status: Never Smoker   . Smokeless tobacco: Never Used  . Alcohol Use: No  . Drug Use: No  . Sexual Activity: Yes    Partners: Female    Birth Control/ Protection: Condom     Comment: "not that much"   Other Topics Concern  . None   Social History Narrative   Widowed. Wife died in [id number] of metastatic breast cancer at age 68 years. Education: college. Pt. Does exercise.   Family History  Problem Relation Age of Onset  . Kidney disease Mother   . COPD Father   . Cancer Brother [id number]    colon cancer   No Known Allergies Prior to Admission medications   Medication Sig Start Date End Date Taking? Authorizing Kesley Gaffey  azithromycin (ZITHROMAX) 500 MG tablet Take 1 tablet (500 mg total) by mouth daily. 10/12/13   Jonita Albeehris W Guest, MD  HYDROcodone-acetaminophen (HYCET) 7.5-325 mg/15 ml solution Take 10-15 mLs by mouth every 6 (six) hours as needed. 10/12/13   Jonita Albeehris W Guest, MD  ipratropium (ATROVENT) 0.03 % nasal spray Place 2 sprays into the nose 2 (two) times  daily. 07/17/13   [name] S Jeffery, PA-C  zoster vaccine live, PF, (ZOSTAVAX) [id number] UNT/0.65ML injection Inject 19,400 Units into the skin once. 07/17/13   [name] S Jeffery, PA-C     ROS: The patient denies fevers, chills, night sweats, unintentional weight loss, chest pain, palpitations, wheezing, dyspnea on exertion, nausea, vomiting, abdominal pain, dysuria, hematuria, melena, numbness, weakness, or tingling.   All other systems have been reviewed and were otherwise negative with the exception of those mentioned in the HPI and as above.    PHYSICAL EXAM: Filed Vitals:   03/19/14 1200  BP: 118/72  Pulse: 64  Temp: 98.2 F (36.8 C)  Resp: 18   Filed Vitals:   03/19/14 1200  Height: 5\' 7"  (1.702 m)  Weight: 170 lb (77.111 kg)   Body mass index is 26.62 kg/(m^2).  General: Alert, no acute distress HEENT:  Normocephalic, atraumatic, oropharynx patent. EOMI, PERRLA, TM normal, no wax Cardiovascular:  Regular rate and rhythm, no rubs murmurs or gallops.  No Carotid bruits, radial pulse intact. No pedal edema.  Respiratory: Clear to auscultation bilaterally.  No wheezes, rales, or rhonchi.  No cyanosis, no use of accessory musculature GI:  No organomegaly, abdomen is soft and non-tender, positive bowel sounds.  No masses. Skin: No rashes. Neurologic: Facial musculature symmetric. Psychiatric: Patient is appropriate throughout our interaction. Lymphatic: No cervical lymphadenopathy Musculoskeletal: Gait intact. + politeal tenderss, minimal Minimal sweling at politeal fossa No appreciable varicose viens No crepitus, neg lachman, stable to varus/valgus, neg mcmurray or jt line tenderness Full ROM 5/5 strength, 2/2 DTRs Straight leg negative Hip --normal + tenderness at PF   LABS: Results for orders placed in visit on 10/12/13  PSA      Result Value Ref Range   PSA 6.65 (*) <=4.00 ng/mL  POCT CBC      Result Value Ref Range   WBC 7.3  4.6 - 10.2 K/uL   Lymph, poc 1.8  0.6  - 3.4   POC LYMPH PERCENT 24.5  10 - 50 %L   MID (cbc) 0.7  0 - 0.9   POC MID % 9.3  0 - 12 %M   POC Granulocyte 4.8  2 - 6.9   Granulocyte percent 66.2  37 - 80 %G   RBC 4.87  4.69 - 6.13 M/uL   Hemoglobin 15.1  14.1 - 18.1 g/dL   HCT, POC 16.147.8  09.643.5 - 53.7 %   MCV 98.1 (*) 80 - 97 fL   MCH, POC 31.0  27 - 31.2 pg   MCHC 31.6 (*) 31.8 - 35.4 g/dL   RDW, POC 04.513.9     Platelet Count, POC 215  142 - 424 K/uL   MPV 7.7  0 - 99.8 fL     EKG/XRAY:   [facility]   ASSESSMENT/PLAN: Encounter Diagnoses  Name Primary?  . Pain in joint, ankle and foot, left Yes  . Pain in joint, ankle and foot, right   . Plantar fasciitis, bilateral   . Posterior knee pain, right   . Hearing loss in left ear    PF exercises given, shoe inserts  Advised, no walking with barefeet, if desires I can write for night splint  Motrin prn for pain Will wait to see if hearing improves, most likely due to shooting guns If continues to have hearing loss then wil refer to audiology  (has left ear whisper test hearing loss. No appreciable cerumen to explainhearing loss) Posterior knee pain most liley due to arthritis or less likely popliteal cyst, NKI , so declined xays today F/u prn    Gross sideeffects, risk and benefits, and alternatives of medications d/w patient. Patient is aware that all medications have potential sideeffects and we are unable to predict every sideeffect or drug-drug interaction that may occur.  LE, THAO PHUONG, DO 03/19/2014 1:02 PM

## 2014-06-19 ENCOUNTER — Telehealth: Payer: Self-pay

## 2014-06-19 NOTE — Telephone Encounter (Signed)
Pt normally sees [name] at the appt. Center, and is planning on having his CPE w/ [name] at the walk in center on either 10/21 or 10/22, and wanted to let [name] know that he will be there first thing in the morning. Also, said that he would give a call the day prior to let her know that , "everything was a go." I advised pt that first thing in the morning is the best time to come in, and let him know I would give [name] the FYI. Pt said if [name] needed to reach him , to leave him a VM on his cell phone.

## 2014-06-28 ENCOUNTER — Telehealth: Payer: Self-pay

## 2014-06-28 NOTE — Telephone Encounter (Signed)
Patient called wanting to give Andrew Hurst a heads up he plans to come in on 07/17/14 for his CPE,  I informed patient Andrew Hurst was working from 9 am to close on that day and he would be able to see her as long as he came at least 2 hours prior to her leaving. Patient stated he would come in around 8:30am to see her. I let patient know he didn't need to let our office know he was coming since it is the walk in clinic but he requested I let Andrew Hurst know.

## 2014-06-28 NOTE — Telephone Encounter (Signed)
FYI

## 2014-07-15 ENCOUNTER — Telehealth: Payer: Self-pay

## 2014-07-15 NOTE — Telephone Encounter (Signed)
Pt wanted [name] to know he would come in on Wednesday to see her. Just wanted to give a heads up. Please call pt at [phone] if needed

## 2014-07-17 ENCOUNTER — Ambulatory Visit (INDEPENDENT_AMBULATORY_CARE_PROVIDER_SITE_OTHER): Payer: 59 | Admitting: Physician Assistant

## 2014-07-17 VITALS — BP 120/76 | HR 62 | Temp 98.2°F | Resp 18 | Ht 66.0 in | Wt 169.0 lb

## 2014-07-17 DIAGNOSIS — Z130 Encounter for screening for diseases of the blood and blood-forming organs and certain disorders involving the immune mechanism: Secondary | ICD-10-CM

## 2014-07-17 DIAGNOSIS — R739 Hyperglycemia, unspecified: Secondary | ICD-10-CM

## 2014-07-17 DIAGNOSIS — R972 Elevated prostate specific antigen [PSA]: Secondary | ICD-10-CM

## 2014-07-17 DIAGNOSIS — Z0000 Encounter for general adult medical examination without abnormal findings: Secondary | ICD-10-CM

## 2014-07-17 DIAGNOSIS — Z23 Encounter for immunization: Secondary | ICD-10-CM

## 2014-07-17 DIAGNOSIS — N400 Enlarged prostate without lower urinary tract symptoms: Secondary | ICD-10-CM

## 2014-07-17 DIAGNOSIS — Z13220 Encounter for screening for lipoid disorders: Secondary | ICD-10-CM

## 2014-07-17 DIAGNOSIS — H9192 Unspecified hearing loss, left ear: Secondary | ICD-10-CM

## 2014-07-17 LAB — LIPID PANEL
Cholesterol: 162 mg/dL (ref 0–200)
HDL: 41 mg/dL (ref >39)
LDL Cholesterol: 104 mg/dL — ABNORMAL HIGH (ref 0–99)
Total CHOL/HDL Ratio: 4 Ratio
Triglycerides: 83 mg/dL (ref <150)
VLDL: 17 mg/dL (ref 0–40)

## 2014-07-17 LAB — POCT CBC
Granulocyte percent: 56.9 % (ref 37–80)
HCT, POC: 38.7 % — AB (ref 43.5–53.7)
Hemoglobin: 12.8 g/dL — AB (ref 14.1–18.1)
Lymph, poc: 2.1 (ref 0.6–3.4)
MCH, POC: 31.6 pg — AB (ref 27–31.2)
MCHC: 33 g/dL (ref 31.8–35.4)
MCV: 95.8 fL (ref 80–97)
MID (cbc): 0.2 (ref 0–0.9)
MPV: 6.9 fL (ref 0–99.8)
POC Granulocyte: 3.1 (ref 2–6.9)
POC LYMPH PERCENT: 38.7 % (ref 10–50)
POC MID %: 4.4 % (ref 0–12)
Platelet Count, POC: 225 K/uL (ref 142–424)
RBC: 4.04 M/uL — AB (ref 4.69–6.13)
RDW, POC: 13.4 %
WBC: 5.5 K/uL (ref 4.6–10.2)

## 2014-07-17 LAB — COMPREHENSIVE METABOLIC PANEL
ALBUMIN: 3.9 g/dL (ref 3.5–5.2)
ALT: 15 U/L (ref 0–53)
AST: 16 U/L (ref 0–37)
Alkaline Phosphatase: 57 U/L (ref 39–117)
BUN: 18 mg/dL (ref 6–23)
CALCIUM: 9 mg/dL (ref 8.4–10.5)
CHLORIDE: 106 meq/L (ref 96–112)
CO2: 29 mEq/L (ref 19–32)
Creat: 1.11 mg/dL (ref 0.50–1.35)
Glucose, Bld: 106 mg/dL — ABNORMAL HIGH (ref 70–99)
POTASSIUM: 4.4 meq/L (ref 3.5–5.3)
Sodium: 140 mEq/L (ref 135–145)
Total Bilirubin: 0.6 mg/dL (ref 0.2–1.2)
Total Protein: 6.1 g/dL (ref 6.0–8.3)

## 2014-07-17 LAB — POCT GLYCOSYLATED HEMOGLOBIN (HGB A1C): Hemoglobin A1C: 5.4

## 2014-07-17 LAB — GLUCOSE, POCT (MANUAL RESULT ENTRY): POC Glucose: 112 mg/dl — AB (ref 70–99)

## 2014-07-17 NOTE — Progress Notes (Signed)
Subjective:    Patient ID: Andrew Hurst, male    DOB: Feb 24, 1946, 68 y.o.   MRN: [id number]   PCP: No PCP Per Patient  Chief Complaint  Patient presents with  . Annual Exam      Active Ambulatory Problems    Diagnosis Date Noted  . BPH (benign prostatic hypertrophy) 07/17/2014   Resolved Ambulatory Problems    Diagnosis Date Noted  . No Resolved Ambulatory Problems   No Additional Past Medical History    Past Surgical History  Procedure Laterality Date  . Hernia repair      No Known Allergies  Prior to Admission medications   Medication Sig Start Date End Date Taking? Authorizing Provider  tamsulosin (FLOMAX) 0.4 MG CAPS capsule Take 0.4 mg by mouth. 3 times a week   Yes Historical Provider, MD    History   Social History  . Marital Status: Widowed    Spouse Name: n/a    Number of Children: 2  . Years of Education: 15   Occupational History  . Sales Associate [employer]   Social History Main Topics  . Smoking status: Never Smoker   . Smokeless tobacco: Never Used  . Alcohol Use: No  . Drug Use: No  . Sexual Activity: Yes    Partners: Female    Birth Control/ Protection: Condom     Comment: "not that much"   Other Topics Concern  . None   Social History Narrative   Lives alone. Widowed. Wife died in 11 of metastatic breast cancer at age 43 years. Education: college. Pt. Does exercise. One child in [place], [place]. One child in [place].    family history includes COPD in his father; Cancer (age of onset: 8) in his brother; Kidney disease in his mother. indicated that his mother is deceased. He indicated that his father is deceased. He indicated that his brother is deceased. He indicated that his maternal grandmother is deceased. He indicated that his maternal grandfather is deceased. He indicated that his paternal grandmother is deceased. He indicated that his paternal grandfather is deceased. He indicated that both of his sons are alive.    HPI  Presents for annual physical exam.  Last visit was exactly 1 year ago.  Reports he received his Shingles vaccine after his last visit, but the pharmacy does not have record of administering it. He is willing to receive a Tdap today, but not willing to receive influenza or Prevnar 13 vaccines due to arm pain x 3 weeks after receiving the pneumovax.  Colonoscopy is current.  History of elevated PSA and glucose.  Saw urology, and started on Flomax, which he uses on a PRN basis for urinary frequency.  Occasional RIGHT elbow pain with grasping or lifting. Brief. Doesn't take anything for it-doesn't last long enough.  Review of Systems As above. No chest pain, SOB, HA, dizziness, vision change, N/V, diarrhea, constipation, dysuria, urinary urgency or frequency, other myalgias, arthralgias or rash.     Objective:   Physical Exam  Constitutional: He is oriented to person, place, and time. Vital signs are normal. He appears well-developed and well-nourished. He is active and cooperative.  Non-toxic appearance. He does not have a sickly appearance. He does not appear ill. No distress.  BP 120/76  Pulse 62  Temp(Src) 98.2 F (36.8 C) (Oral)  Resp 18  Ht 5\' 6"  (1.676 m)  Wt 169 lb (76.658 kg)  BMI 27.29 kg/m2  SpO2 97%   HENT:  Head:  Normocephalic and atraumatic.  Right Ear: Hearing, tympanic membrane, external ear and ear canal normal.  Left Ear: Hearing, tympanic membrane, external ear and ear canal normal.  Nose: Nose normal.  Mouth/Throat: Uvula is midline, oropharynx is clear and moist and mucous membranes are normal. He does not have dentures. No oral lesions. No trismus in the jaw. Normal dentition. No dental abscesses, uvula swelling, lacerations or dental caries.  Eyes: Conjunctivae, EOM and lids are normal. Pupils are equal, round, and reactive to light. Right eye exhibits no discharge. Left eye exhibits no discharge. No scleral icterus.  Fundoscopic exam:      The right  eye shows no arteriolar narrowing, no AV nicking, no exudate, no hemorrhage and no papilledema.       The left eye shows no arteriolar narrowing, no AV nicking, no exudate, no hemorrhage and no papilledema.  Neck: Normal range of motion, full passive range of motion without pain and phonation normal. Neck supple. No spinous process tenderness and no muscular tenderness present. No rigidity. No tracheal deviation, no edema, no erythema and normal range of motion present. No thyromegaly present.  Cardiovascular: Normal rate, regular rhythm, S1 normal, S2 normal, normal heart sounds, intact distal pulses and normal pulses.  Exam reveals no gallop and no friction rub.   No murmur heard. Pulmonary/Chest: Effort normal and breath sounds normal. No respiratory distress. He has no wheezes. He has no rales.  Abdominal: Soft. Normal appearance and bowel sounds are normal. He exhibits no distension and no mass. There is no hepatosplenomegaly. There is no tenderness. There is no rebound and no guarding. No hernia. Hernia confirmed negative in the right inguinal area and confirmed negative in the left inguinal area.  Genitourinary: Rectum normal, testes normal and penis normal. Guaiac negative stool. Prostate is enlarged (soft, without masses). Prostate is not tender. Circumcised. No phimosis, paraphimosis, hypospadias, penile erythema or penile tenderness. No discharge found.  Musculoskeletal: Normal range of motion. He exhibits no edema and no tenderness.       Right shoulder: Normal.       Left shoulder: Normal.       Right elbow: Normal.      Left elbow: Normal.       Right wrist: Normal.       Left wrist: Normal.       Right hip: Normal.       Left hip: Normal.       Right knee: Normal.       Left knee: Normal.       Right ankle: Normal. Achilles tendon normal.       Left ankle: Normal. Achilles tendon normal.       Cervical back: Normal. He exhibits normal range of motion, no tenderness, no bony  tenderness, no swelling, no edema, no deformity, no laceration, no pain, no spasm and normal pulse.       Thoracic back: Normal.       Lumbar back: Normal.       Right upper arm: Normal.       Left upper arm: Normal.       Right forearm: Normal.       Left forearm: Normal.       Right hand: Normal.       Left hand: Normal.       Right upper leg: Normal.       Left upper leg: Normal.       Right lower leg: Normal.  Left lower leg: Normal.       Right foot: Normal.       Left foot: Normal.  Lymphadenopathy:       Head (right side): No submental, no submandibular, no tonsillar, no preauricular, no posterior auricular and no occipital adenopathy present.       Head (left side): No submental, no submandibular, no tonsillar, no preauricular, no posterior auricular and no occipital adenopathy present.    He has no cervical adenopathy.       Right: No inguinal and no supraclavicular adenopathy present.       Left: No inguinal and no supraclavicular adenopathy present.  Neurological: He is alert and oriented to person, place, and time. He has normal strength and normal reflexes. He displays no tremor. No cranial nerve deficit. He exhibits normal muscle tone. Coordination and gait normal.  Skin: Skin is warm, dry and intact. No abrasion, no ecchymosis, no laceration, no lesion and no rash noted. He is not diaphoretic. No cyanosis or erythema. No pallor. Nails show no clubbing.  Psychiatric: He has a normal mood and affect. His speech is normal and behavior is normal. Judgment and thought content normal. Cognition and memory are normal.      Results for orders placed in visit on 07/17/14  POCT CBC      Result Value Ref Range   WBC 5.5  4.6 - 10.2 K/uL   Lymph, poc 2.1  0.6 - 3.4   POC LYMPH PERCENT 38.7  10 - 50 %L   MID (cbc) 0.2  0 - 0.9   POC MID % 4.4  0 - 12 %M   POC Granulocyte 3.1  2 - 6.9   Granulocyte percent 56.9  37 - 80 %G   RBC 4.04 (*) 4.69 - 6.13 M/uL   Hemoglobin 12.8  (*) 14.1 - 18.1 g/dL   HCT, POC 13.038.7 (*) 86.543.5 - 53.7 %   MCV 95.8  80 - 97 fL   MCH, POC 31.6 (*) 27 - 31.2 pg   MCHC 33.0  31.8 - 35.4 g/dL   RDW, POC 78.413.4     Platelet Count, POC 225  142 - 424 K/uL   MPV 6.9  0 - 99.8 fL  GLUCOSE, POCT (MANUAL RESULT ENTRY)      Result Value Ref Range   POC Glucose 112 (*) 70 - 99 mg/dl       Assessment & Plan:  1. Annual physical exam Age appropriate anticipatory guidance provided.  2. BPH (benign prostatic hypertrophy) 3. Elevated PSA Continue tamsulosin prn. May increase to daily if symptoms worsen. - PSA  4. Hyperglycemia Elevated fasting glucose. Await A1C. - Comprehensive metabolic panel - POCT glucose (manual entry) - POCT glycosylated hemoglobin (Hb A1C)  5. Hearing loss of left ear Not interested in audiology evaluation or hearing aid at present.  6. Need for vaccination with 13-polyvalent pneumococcal conjugate vaccine Declines today.   7. Need for Tdap vaccination - Tdap vaccine greater than or equal to 7yo IM  8. Need for shingles vaccine Reports he's had it already, but the pharmacy doesn't have a record of administering it. Will ask him if he can think of an alternate pharmacy where he may have received it, or if he has documentation somewhere.  9. Need for influenza vaccination Declines today, but says he may come back for it another day.  10. Screening for deficiency anemia - POCT CBC  11. Screening for hyperlipidemia - Lipid panel   Andrew Hurst  S. Doriana Mazurkiewicz, PA-C Physician Assistant-Certified [facility]  

## 2014-07-17 NOTE — Patient Instructions (Signed)

## 2014-07-18 LAB — PSA: PSA: 4.89 ng/mL — AB (ref <=4.00)

## 2014-07-22 ENCOUNTER — Encounter: Payer: Self-pay | Admitting: Physician Assistant

## 2014-07-30 ENCOUNTER — Encounter: Payer: 59 | Admitting: Physician Assistant

## 2015-01-15 ENCOUNTER — Ambulatory Visit (INDEPENDENT_AMBULATORY_CARE_PROVIDER_SITE_OTHER): Payer: 59 | Admitting: Physician Assistant

## 2015-01-15 VITALS — BP 122/74 | HR 50 | Temp 97.4°F | Resp 16 | Ht 67.0 in | Wt 167.0 lb

## 2015-01-15 DIAGNOSIS — L608 Other nail disorders: Secondary | ICD-10-CM | POA: Diagnosis not present

## 2015-01-15 LAB — POCT SKIN KOH: Skin KOH, POC: NEGATIVE

## 2015-01-15 NOTE — Progress Notes (Signed)
   Subjective:    Patient ID: Andrew Hurst, male    DOB: June 25, 1946, 69 y.o.   MRN: [id number]  HPI Patient presents for discoloration of nails of 1st and 2nd toe of right foot. Has been present for past 2 months and denies pain or itching. 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 Shoes are wet throughout day. Has had tinea pedis over 20 years ago that was treated with a cream. Nails have not become more brittle and denies fever. No h/o thyroid dz, anemia, or kidney/liver dz. NKDA.   Review of Systems  Constitutional: Negative for fever and fatigue.  Musculoskeletal: Negative for joint swelling and gait problem.  Skin: Positive for color change. Negative for pallor, rash and wound.  Allergic/Immunologic: Negative for immunocompromised state.       Objective:   Physical Exam  Constitutional: He is oriented to person, place, and time. He appears well-developed and well-nourished. No distress.  Blood pressure 122/74, pulse 50, temperature 97.4 F (36.3 C), temperature source Oral, resp. rate 16, height 5\' 7"  (1.702 m), weight 167 lb (75.751 kg), SpO2 98 %.  HENT:  Head: Normocephalic and atraumatic.  Right Ear: External ear normal.  Left Ear: External ear normal.  Eyes: Conjunctivae are normal. Right eye exhibits no discharge. Left eye exhibits no discharge.  Pulmonary/Chest: Effort normal.  Neurological: He is alert and oriented to person, place, and time.  Skin: Skin is warm and dry. No rash noted. He is not diaphoretic. No erythema. No pallor.  1st, 2nd, and 3rd toes of right foot and 1st toe of left foot yellowish color at the top of nail to approximately midway.    Results for orders placed or performed in visit on 01/15/15  POCT Skin KOH  Result Value Ref Range   Skin KOH, POC Negative       Assessment & Plan:  1. Discoloration of nail Color likely staining due to tanning from shoes as feet are wet throughout day. Will hold off on  starting Lamisil po until culture results.  - POCT Skin KOH - Fungus culture w smear   Janan Ridgeishira Letrice Pollok PA-C  [facility] 01/15/2015 8:58 AM

## 2015-01-15 NOTE — Patient Instructions (Signed)
Fungal culture takes 4 weeks approximately to result.

## 2015-01-16 LAB — KOH PREP: RESULT - KOH: NONE SEEN

## 2015-01-26 ENCOUNTER — Ambulatory Visit (INDEPENDENT_AMBULATORY_CARE_PROVIDER_SITE_OTHER): Payer: 59 | Admitting: Emergency Medicine

## 2015-01-26 VITALS — BP 126/64 | HR 72 | Temp 97.6°F | Resp 16 | Ht 67.0 in | Wt 168.4 lb

## 2015-01-26 DIAGNOSIS — L03313 Cellulitis of chest wall: Secondary | ICD-10-CM

## 2015-01-26 MED ORDER — SULFAMETHOXAZOLE-TRIMETHOPRIM 800-160 MG PO TABS: 1.0000 | ORAL_TABLET | Freq: Two times a day (BID) | ORAL | Status: DC

## 2015-01-26 NOTE — Patient Instructions (Signed)

## 2015-01-26 NOTE — Progress Notes (Signed)
[facility] [address], [place] [address] [phone]  Date:  01/26/2015   Name:  Andrew Hurst   DOB:  06/08/1946   MRN:  [id number]  PCP:  No PCP Per Patient    Chief Complaint: Insect Bite   History of Present Illness:  Andrew Hurst is a 69 y.o. very pleasant male patient who presents with the following:  Thinks was bitten by a mosquito.  Now has painful red and tender No fever or chills No vesicular nature Not pruritic No improvement with over the counter medications or other home remedies.  Denies other complaint or health concern today.   Patient Active Problem List   Diagnosis Date Noted  . BPH (benign prostatic hypertrophy) 07/17/2014  . Hearing loss of left ear 07/17/2014  . Hyperglycemia 07/17/2014    History reviewed. No pertinent past medical history.  Past Surgical History  Procedure Laterality Date  . Hernia repair      History  Substance Use Topics  . Smoking status: Never Smoker   . Smokeless tobacco: Never Used  . Alcohol Use: No    Family History  Problem Relation Age of Onset  . Kidney disease Mother   . COPD Father   . Cancer Brother [id number]    colon cancer    No Known Allergies  Medication list has been reviewed and updated.  Current Outpatient Prescriptions on File Prior to Visit  Medication Sig Dispense Refill  . tamsulosin (FLOMAX) 0.4 MG CAPS capsule Take 0.4 mg by mouth. 3 times a week     No current facility-administered medications on file prior to visit.    Review of Systems:  Review of Systems  Constitutional: Negative for fever, chills and fatigue.  HENT: Negative for congestion, ear pain, hearing loss, postnasal drip, rhinorrhea and sinus pressure.   Eyes: Negative for discharge and redness.  Respiratory: Negative for cough, shortness of breath and wheezing.   Cardiovascular: Negative for chest pain and leg swelling.  Gastrointestinal: Negative for nausea, vomiting, abdominal pain,  constipation and blood in stool.  Genitourinary: Negative for dysuria, urgency and frequency.  Musculoskeletal: Negative for neck stiffness.  Skin: Negative for rash.  Neurological: Negative for seizures, weakness and headaches.     Physical Examination: Filed Vitals:   01/26/15 0838  BP: 126/64  Pulse: 72  Temp: 97.6 F (36.4 C)  Resp: 16   Filed Vitals:   01/26/15 0838  Height: 5\' 7"  (1.702 m)  Weight: 168 lb 6.4 oz (76.386 kg)   Body mass index is 26.37 kg/(m^2). Ideal Body Weight: Weight in (lb) to have BMI = 25: 159.3   GEN: WDWN, NAD, Non-toxic, Alert & Oriented x 3 HEENT: Atraumatic, Normocephalic.  Ears and Nose: No external deformity. EXTR: No clubbing/cyanosis/edema NEURO: Normal gait.  PSYCH: Normally interactive. Conversant. Not depressed or anxious appearing.  Calm demeanor.  RIGHT flank:  Erythematous tender surrounding scabbed lesion  Assessment and Plan: Cellulitis flank Septra   Signed Phillips OdorJeffery Tavion Senkbeil, MD

## 2015-01-31 LAB — CULTURE, FUNGUS WITHOUT SMEAR

## 2015-02-05 ENCOUNTER — Ambulatory Visit (INDEPENDENT_AMBULATORY_CARE_PROVIDER_SITE_OTHER): Payer: 59 | Admitting: Urgent Care

## 2015-02-05 VITALS — BP 132/68 | HR 68 | Temp 97.8°F | Resp 16 | Ht 67.0 in | Wt 167.0 lb

## 2015-02-05 DIAGNOSIS — S30861A Insect bite (nonvenomous) of abdominal wall, initial encounter: Secondary | ICD-10-CM

## 2015-02-05 DIAGNOSIS — L03313 Cellulitis of chest wall: Secondary | ICD-10-CM | POA: Diagnosis not present

## 2015-02-05 DIAGNOSIS — W57XXXA Bitten or stung by nonvenomous insect and other nonvenomous arthropods, initial encounter: Secondary | ICD-10-CM | POA: Diagnosis not present

## 2015-02-05 MED ORDER — CEPHALEXIN 500 MG PO CAPS: 500.0000 mg | ORAL_CAPSULE | Freq: Three times a day (TID) | ORAL | Status: AC

## 2015-02-05 MED ORDER — MUPIROCIN 2 % EX OINT: 1.0000 "application " | TOPICAL_OINTMENT | Freq: Two times a day (BID) | CUTANEOUS | Status: DC

## 2015-02-05 NOTE — Patient Instructions (Signed)
[place] Spotted Fever [place] Spotted Fever (RMSF) is the oldest known tick-borne disease of people in the [place]. This disease was named because it was first described among people in the [place] area who had an illness characterized by a rash with red-purple-black spots. This disease is caused by a rickettsia (Rickettsia rickettsii), a bacteria carried by the tick. The [place] wood tick and the [place] dog tick acquire and transmit the RMSF bacteria (pictures NOT actual size). When a larval, nymphal, or adult tick feeds on an infected rodent or larger animal, the tick can become infected. Infected adult ticks then feed on people who may then get RMSF. The tick transmits the disease to humans during a prolonged period of feeding that lasts many hours, days, or even a couple weeks. The bite is painless and frequently goes unnoticed. An infected male tick may also pass the rickettsial bacteria to her eggs that then may mature to be infected adult ticks. The rickettsia that causes RMSF can also get into a person's body through damaged skin. A tick bite is not necessary. People can get RMSF if they crush a tick and get its blood or body fluids on their skin through a small cut or sore.  DIAGNOSIS Diagnosis is made by laboratory tests.  TREATMENT Treatment is with antibiotics (medications that kill rickettsia and other bacteria). Immediate treatment usually prevents death. GEOGRAPHIC RANGE This disease was reported only in the [place] until 1931. RMSF has more recently been described among individuals in all states except [place], [place], and [place]. The highest reported incidences of RMSF now occur among residents of [place], [place], [place], and the [place]. TIME OF YEAR  Most cases are diagnosed during late spring and summer when ticks are most active. However, especially in the warmer southern states, a few cases occur during the winter. SYMPTOMS    Symptoms of RMSF begin from 2 to 14 days after a tick bite. The most common early symptoms are fever, muscle aches, and headache followed by nausea (feeling sick to your stomach) or vomiting.  The RMSF rash is typically delayed until 3 or more days after symptom onset, and eventually develops in 9 of 10 infected patients by the fifth day of illness. If the disease is not treated it can cause death. If you get a fever, headache, muscle aches, rash, nausea, or vomiting within 2 weeks of a possible tick bite or exposure, you should see your caregiver immediately. PREVENTION Ticks prefer to hide in shady, moist ground litter. They can often be found above the ground clinging to tall grass, brush, shrubs and low tree branches. They also inhabit lawns and gardens, especially at the edges of woodlands and around old stone walls. Within the areas where ticks generally live, no naturally vegetated area can be considered completely free of infected ticks. The best precaution against RMSF is to avoid contact with soil, leaf litter, and vegetation as much as possible in tick-infested areas. For those who enjoy gardening or walking in their yards, clear brush and mow tall grass around houses and at the edges of gardens. This may help reduce the tick population in the immediate area. Applications of chemical insecticides by a licensed professional in the spring (late May) and fall (September) will also control ticks, especially in heavily infested areas. Treatment will never get rid of all the ticks. Getting rid of small animal populations that host ticks will also decrease the tick population. When working in the garden, pruning   shrubs, or handling soil and vegetation, wear light-colored protective clothing and gloves. Spot-check often to prevent ticks from reaching the skin. Ticks cannot jump or fly. They will not drop from an above-ground perch onto a passing animal. Once a tick gains access to human skin it climbs  upward until it reaches a more protected area. For example, the back of the knee, groin, navel, armpit, ears, or nape of the neck. It then begins the slow process of embedding itself in the skin. Campers, hikers, field workers, and others who spend time in wooded, brushy, or tall grassy areas can avoid exposure to ticks by using the following precautions:  Wear light-colored clothing with a tight weave to spot ticks more easily and prevent contact with the skin.  Wear long pants tucked into socks, long-sleeved shirts tucked into pants and enclosed shoes or boots along with insect repellent.  Spray clothes with insect repellent containing either DEET or Permethrin. Only DEET can be used on exposed skin. Follow the manufacturer's directions carefully.  Wear a hat and keep long hair pulled back.  Stay on cleared, well-worn trails whenever possible.  Spot-check yourself and others often for the presence of ticks on clothes. If you find one, there are likely to be others. Check thoroughly.  Remove clothes after leaving tick-infested areas. If possible, wash them to eliminate any unseen ticks. Check yourself, your children and any pets from head to toe for the presence of ticks.  Shower and shampoo. You can greatly reduce your chances of contracting RMSF if you remove attached ticks as soon as possible. Regular checks of the body, including all body sites covered by hair (head, armpits, genitals), allow removal of the tick before rickettsial transmission. To remove an attached tick, use a forceps or tweezers to detach the intact tick without leaving mouth parts in the skin. The tick bite wound should be cleansed after tick removal. Remember the most common symptoms of RMSF are fever, muscle aches, headache, and nausea or vomiting with a later onset of rash. If you get these symptoms after a tick bite and while living in an area where RMSF is found, RMSF should be suspected. If the disease is not  treated, it can cause death. See your caregiver immediately if you get these symptoms. Do this even if not aware of a tick bite. Document Released: 12/26/2000 Document Revised: 01/28/2014 Document Reviewed: 08/18/2009 ExitCare Patient Information 2015 [employer]. This information is not intended to replace advice given to you by your health care provider. Make sure you discuss any questions you have with your health care provider.    Lyme Disease You may have been bitten by a tick and are to watch for the development of Lyme Disease. Lyme Disease is an infection that is caused by a bacteria The bacteria causing this disease is named Borreilia burgdorferi. If a tick is infected with this bacteria and then bites you, then Lyme Disease may occur. These ticks are carried by deer and rodents such as rabbits and mice and infest grassy as well as forested areas. Fortunately most tick bites do not cause Lyme Disease.  Lyme Disease is easier to prevent than to treat. First, covering your legs with clothing when walking in areas where ticks are possibly abundant will prevent their attachment because ticks tend to stay within inches of the ground. Second, using insecticides containing DEET can be applied on skin or clothing. Last, because it takes about 12 to 24 hours for the tick to   transmit the disease after attachment to the human host, you should inspect your body for ticks twice a day when you are in areas where Lyme Disease is common. You must look thoroughly when searching for ticks. The Ixodes tick that carries Lyme Disease is very small. It is around the size of a sesame seed (picture of tick is not actual size). Removal is best done by grasping the tick by the head and pulling it out. Do not to squeeze the body of the tick. This could inject the infecting bacteria into the bite site. Wash the area of the bite with an antiseptic solution after removal.  Lyme Disease is a disease that may affect many body  systems. Because of the small size of the biting tick, most people do not notice being bitten. The first sign of an infection is usually a round red rash that extends out from the center of the tick bite. The center of the lesion may be blood colored (hemorrhagic) or have tiny blisters (vesicular). Most lesions have bright red outer borders and partial central clearing. This rash may extend out many inches in diameter, and multiple lesions may be present. Other symptoms such as fatigue, headaches, chills and fever, general achiness and swelling of lymph glands may also occur. If this first stage of the disease is left untreated, these symptoms may gradually resolve by themselves, or progressive symptoms may occur because of spread of infection to other areas of the body.  Follow up with your caregiver to have testing and treatment if you have a tick bite and you develop any of the above complaints. Your caregiver may recommend preventative (prophylactic) medications which kill bacteria (antibiotics). Once a diagnosis of Lyme Disease is made, antibiotic treatment is highly likely to cure the disease. Effective treatment of late stage Lyme Disease may require longer courses of antibiotic therapy.  MAKE SURE YOU:   Understand these instructions.  Will watch your condition.  Will get help right away if you are not doing well or get worse. Document Released: 12/20/2000 Document Revised: 12/06/2011 Document Reviewed: 02/21/2009 ExitCare Patient Information 2015 [employer]. This information is not intended to replace advice given to you by your health care provider. Make sure you discuss any questions you have with your health care provider.  

## 2015-02-05 NOTE — Progress Notes (Signed)
    MRN: [id number] DOB: Sep 15, 1946  Subjective:   Andrew Hurst is a 69 y.o. male presenting for chief complaint of Tick Removal  Reports 1 day history of tick bite. Patient is worried about Lyme Disease. States that he did yard-work on Monday. Started itching Monday night and continued with this on Tuesday. He checked his chest and removed a tick. Has also had some redness over bite. Has tried Neosporin without noticing any significant changes. Denies fevers, drainage of pus, bleeding, edema, cough, conjunctivitis, n/v, abdominal pain, chest pain, rash over hands or lower legs. Denies any other aggravating or relieving factors, no other questions or concerns.  Andrew Hurst is currently taking tamsulosin. He has No Known Allergies.  Andrew Hurst  has no past medical history on file. Also  has past surgical history that includes Hernia repair.  ROS As in subjective.  Objective:   Vitals: BP 132/68 mmHg  Pulse 68  Temp(Src) 97.8 F (36.6 C) (Oral)  Resp 16  Ht 5\' 7"  (1.702 m)  Wt 167 lb (75.751 kg)  BMI 26.15 kg/m2  SpO2 97%  Physical Exam  Constitutional: He is oriented to person, place, and time. He appears well-developed and well-nourished.  Cardiovascular: Normal rate.   Pulmonary/Chest: Effort normal.  Neurological: He is alert and oriented to person, place, and time.  Skin: Skin is warm and dry.      Assessment and Plan :   1. Cellulitis of chest wall 2. Tick bite - Discussed signs and symptoms of RMSF, less likely to be Lyme Disease but counseled on this as well. For now, start Keflex and Mupirocin for cellulitis secondary to bug bite. Return to clinic in 1 week if symptoms fail to resolve.  Wallis BambergMario Junelle Hashemi, PA-C [facility] [phone] 02/05/2015 6:14 PM

## 2015-10-07 ENCOUNTER — Telehealth: Payer: Self-pay

## 2015-10-07 NOTE — Telephone Encounter (Signed)
Which medications? I dont see anything but an ointment.

## 2015-10-07 NOTE — Telephone Encounter (Signed)
Pt states that with new insurance all rx's that are prescribed are only covered at the generic level.. Pt would like rx changes due to cost// will ask pharm to send a list of alternatives... PT states he will call back later with more information.

## 2016-01-17 ENCOUNTER — Ambulatory Visit (INDEPENDENT_AMBULATORY_CARE_PROVIDER_SITE_OTHER): Payer: 59

## 2016-01-17 ENCOUNTER — Ambulatory Visit (INDEPENDENT_AMBULATORY_CARE_PROVIDER_SITE_OTHER): Payer: 59 | Admitting: Family Medicine

## 2016-01-17 VITALS — BP 128/68 | HR 70 | Temp 97.5°F | Resp 16 | Ht 67.0 in | Wt 171.6 lb

## 2016-01-17 DIAGNOSIS — M25561 Pain in right knee: Secondary | ICD-10-CM

## 2016-01-17 MED ORDER — MELOXICAM 15 MG PO TABS: 15.0000 mg | ORAL_TABLET | Freq: Every day | ORAL | Status: DC

## 2016-01-17 NOTE — Progress Notes (Signed)
By signing my name below I, Shelah Lewandowsky, attest that this documentation has been prepared under the direction and in the presence of Norberto Sorenson, MD. Electonically Signed. Shelah Lewandowsky, Scribe 01/17/2016 at 11:15 AM   Subjective:    Patient ID: Andrew Hurst, male    DOB: 21-Nov-1945, 70 y.o.   MRN: [id number]  Chief Complaint  Patient presents with  . Knee Pain    right knee, on and off for a year    HPI Andrew Hurst is a 70 y.o. male who presents to the [facility] complaining of rt knee pain that has been intermittent for the past year. Knee pain is in the medial aspect. Pain is worsened after being on it all day. Pain is minimal in the mornings. Pt also reports that sometimes his knee feels like it locks up. Pt states that he thinks the posterior part of rt knee is retaining fluid which also worsens with activity. Pt denies trying to use any wraps or braces. Pt states he has tried   Pt denies any falls, injury to knee. Pt also denies ever having his rt knee give out on him. Pt denies any lower leg swelling.  History reviewed. No pertinent past medical history.   Current outpatient prescriptions:  .  tamsulosin (FLOMAX) 0.4 MG CAPS capsule, Take 0.4 mg by mouth. 3 times a week, Disp: , Rfl:  .  mupirocin ointment (BACTROBAN) 2 %, Apply 1 application topically 2 (two) times daily. (Patient not taking: Reported on 01/17/2016), Disp: 15 g, Rfl: 0  No Known Allergies  Depression screen [facility] 2/9 01/17/2016 02/05/2015 01/26/2015  Decreased Interest 0 0 0  Down, Depressed, Hopeless 0 0 0  PHQ - 2 Score 0 0 0      Review of Systems  Constitutional: Positive for activity change.  HENT: Negative for congestion.   Eyes: Negative for visual disturbance.  Respiratory: Negative for cough and chest tightness.   Cardiovascular: Negative for chest pain and leg swelling.  Gastrointestinal: Negative for nausea and abdominal pain.  Genitourinary: Negative for dysuria  and difficulty urinating.  Musculoskeletal: Positive for joint swelling and arthralgias. Negative for myalgias and gait problem.       Positive for rt knee pain  Skin: Negative for color change, rash and wound.  Neurological: Negative for dizziness, weakness, light-headedness, numbness and headaches.  Psychiatric/Behavioral: The patient is not nervous/anxious.        Objective:  BP 128/68 mmHg  Pulse 70  Temp(Src) 97.5 F (36.4 C) (Oral)  Resp 16  Ht  (1.702 m)  Wt 171 lb 9.6 oz (77.837 kg)  BMI 26.87 kg/m2  SpO2 97%  Physical Exam  Constitutional: He is oriented to person, place, and time. He appears well-developed and well-nourished. No distress.  HENT:  Head: Normocephalic and atraumatic.  Eyes: Conjunctivae are normal. Pupils are equal, round, and reactive to light.  Neck: Neck supple.  Cardiovascular: Normal rate.   Pulmonary/Chest: Effort normal.  Musculoskeletal:       Right knee: He exhibits decreased range of motion. He exhibits no deformity, no LCL laxity and normal patellar mobility. No tenderness found. No medial joint line and no lateral joint line tenderness noted.  Rt knee exam shows no crepitus, no laxity. Decreased ROM of 10 degrees of extension of rt knee. fullnss in popliteal region of rt knee Pain with maximal flexion in of rt knee in popliteal region. Negative McMurray sign of rt knee. Pt's rt  patella is stable.   Neurological: He is alert and oriented to person, place, and time.  Skin: Skin is warm and dry.  Psychiatric: He has a normal mood and affect. His behavior is normal.  Nursing note and vitals reviewed.  Dg Knee 1-2 Views Right  01/17/2016  CLINICAL DATA:  70 year old male with knee pain and swelling in the popliteal fossa EXAM: RIGHT KNEE - 1-2 VIEW COMPARISON:  None. FINDINGS: There is no evidence of fracture, or dislocation. Trace effusion in the suprapatellar recess. There is no evidence of arthropathy or other focal bone abnormality.  Soft tissues are unremarkable. IMPRESSION: No acute fracture or malalignment. Trace joint effusion in the suprapatellar recess. Electronically Signed   By: Malachy MoanHeath  McCullough M.D.   On: 01/17/2016 10:31          Assessment & Plan:   1. Knee pain, acute, right   Try reaction web brace. RICE If sxs cont after 6 wks then either ortho referral or mri  Orders Placed This Encounter  Procedures  . DG Knee 1-2 Views Right    Standing Status: Future     Number of Occurrences: 1     Standing Expiration Date: 01/16/2017    Order Specific Question:  Reason for Exam (SYMPTOM  OR DIAGNOSIS REQUIRED)    Answer:  pain at extremes of flex/extension, c/o swelling in popliteal fossa, pleaes get standing xray    Order Specific Question:  Preferred imaging location?    Answer:  External    Meds ordered this encounter  Medications  . DISCONTD: meloxicam (MOBIC) 15 MG tablet    Sig: Take 1 tablet (15 mg total) by mouth daily.    Dispense:  30 tablet    Refill:  0  . meloxicam (MOBIC) 15 MG tablet    Sig: Take 1 tablet (15 mg total) by mouth daily.    Dispense:  30 tablet    Refill:  1    I personally performed the services described in this documentation, which was scribed in my presence. The recorded information has been reviewed and considered, and addended by me as needed.  Norberto SorensonEva Shaw, MD MPH

## 2016-01-17 NOTE — Patient Instructions (Addendum)
IF you received an x-ray today, you will receive an invoice from [facility]. Please contact [facility] at [phone] with questions or concerns regarding your invoice.   IF you received labwork today, you will receive an invoice from [facility]. Please contact [facility] at [phone] with questions or concerns regarding your invoice.   Our billing staff will not be able to assist you with questions regarding bills from these companies.  You will be contacted with the lab results as soon as they are available. The fastest way to get your results is to activate your My Chart account. Instructions are located on the last page of this paperwork. If you have not heard from us regarding the results in 2 weeks, please contact this office.     Meniscus Tear With Phase I Rehab The meniscus is a C-shaped cartilage structure, located in the knee joint between the thigh bone (femur) and the shinbone (tibia). Two menisci are located in each knee joint: the inner and outer meniscus. The meniscus acts as an adapter between the thigh bone and shinbone, allowing them to fit properly together. It also functions as a shock absorber, to reduce the stress placed on the knee joint and to help supply nutrients to the knee joint cartilage. As people age, the meniscus begins to harden and become more vulnerable to injury. Meniscus tears are a common injury, especially in older athletes. Inner meniscus tears are more common than outer meniscus tears.  SYMPTOMS   Pain in the knee, especially with standing or squatting with the affected leg.  Tenderness along the joint line.  Swelling in the knee joint (effusion), usually starting 1 to 2 days after injury.  Locking or catching of the knee joint, causing inability to straighten the knee completely.  Giving way or buckling of the knee. CAUSES  A meniscus tear occurs when a force is placed on the meniscus that is  greater than it can handle. Common causes of injury include:  Direct hit (trauma) to the knee.  Twisting, pivoting, or cutting (rapidly changing direction while running), kneeling or squatting.  Without injury, due to aging. RISK INCREASES WITH:  Contact sports (football, rugby).  Sports in which cleats are used with pivoting (soccer, lacrosse) or sports in which good shoe grip and sudden change in direction are required (racquetball, basketball, squash).  Previous knee injury.  Associated knee injury, particularly ligament injuries.  Poor strength and flexibility. PREVENTION  Warm up and stretch properly before activity.  Maintain physical fitness:  Strength, flexibility, and endurance.  Cardiovascular fitness.  Protect the knee with a brace or elastic bandage.  Wear properly fitted protective equipment (proper cleats for the surface). PROGNOSIS  Sometimes, meniscus tears heal on their own. However, definitive treatment requires surgery, followed by at least 6 weeks of recovery.  RELATED COMPLICATIONS   Recurring symptoms that result in a chronic problem.  Repeated knee injury, especially if sports are resumed too soon after injury or surgery.  Progression of the tear (the tear gets larger), if untreated.  Arthritis of the knee in later years (with or without surgery).  Complications of surgery, including infection, bleeding, injury to nerves (numbness, weakness, paralysis) continued pain, giving way, locking, nonhealing of meniscus (if repaired), need for further surgery, and knee stiffness (loss of motion). TREATMENT  Treatment first involves the use of ice and medicine, to reduce pain and inflammation. You may find using crutches to walk more comfortable. However, it is okay to  bear weight on the injured knee, if the pain will allow it. Surgery is often advised as a definitive treatment. Surgery is performed through an incision near the joint (arthroscopically). The  torn piece of the meniscus is removed, and if possible the joint cartilage is repaired. After surgery, the joint must be restrained. After restraint, it is important to perform strengthening and stretching exercises to help regain strength and a full range of motion. These exercises may be completed at home or with a therapist.  MEDICATION  If pain medicine is needed, nonsteroidal anti-inflammatory medicines (aspirin and ibuprofen), or other minor pain relievers (acetaminophen), are often advised.  Do not take pain medicine for 7 days before surgery.  Prescription pain relievers may be given, if your caregiver thinks they are needed. Use only as directed and only as much as you need. HEAT AND COLD  Cold treatment (icing) should be applied for 10 to 15 minutes every 2 to 3 hours for inflammation and pain, and immediately after activity that aggravates your symptoms. Use ice packs or an ice massage.  Heat treatment may be used before performing stretching and strengthening activities prescribed by your caregiver, physical therapist, or athletic trainer. Use a heat pack or a warm water soak. SEEK MEDICAL CARE IF:   Symptoms get worse or do not improve in 2 weeks, despite treatment.  New, unexplained symptoms develop. (Drugs used in treatment may produce side effects.) EXERCISES RANGE OF MOTION (ROM) AND STRETCHING EXERCISES - Meniscus Tear, Non-operative, Phase I These are some of the initial exercises with which you may start your rehabilitation program, until you see your caregiver again or until your symptoms are resolved. Remember:   These initial exercises are intended to be gentle. They will help you restore motion without increasing any swelling.  Completing these exercises allows less painful movement and prepares you for the more aggressive strengthening exercises in Phase II.  An effective stretch should be held for at least 30 seconds.  A stretch should never be painful. You  should only feel a gentle lengthening or release in the stretched tissue. RANGE OF MOTION - Knee Flexion, Active  Lie on your back with both knees straight. (If this causes back discomfort, bend your healthy knee, placing your foot flat on the floor.)  Slowly slide your heel back toward your buttocks until you feel a gentle stretch in the front of your knee or thigh.  Hold for __________ seconds. Slowly slide your heel back to the starting position. Repeat __________ times. Complete this exercise __________ times per day.  RANGE OF MOTION - Knee Flexion and Extension, Active-Assisted  Sit on the edge of a table or chair with your thighs firmly supported. It may be helpful to place a folded towel under the end of your right / left thigh.  Flexion (bending): Place the ankle of your healthy leg on top of the other ankle. Use your healthy leg to gently bend your right / left knee until you feel a mild tension across the top of your knee.  Hold for __________ seconds.  Extension (straightening): Switch your ankles so your right / left leg is on top. Use your healthy leg to straighten your right / left knee until you feel a mild tension on the backside of your knee.  Hold for __________ seconds. Repeat __________ times. Complete __________ times per day. STRETCH - Knee Flexion, Supine  Lie on the floor with your right / left heel and foot lightly touching the wall. (  Place both feet on the wall if you do not use a door frame.)  Without using any effort, allow gravity to slide your foot down the wall slowly until you feel a gentle stretch in the front of your right / left knee.  Hold this stretch for __________ seconds. Then return the leg to the starting position, using your healthy leg for help, if needed. Repeat __________ times. Complete this stretch __________ times per day.  STRETCH - Knee Extension Sitting  Sit with your right / left leg/heel propped on another chair, coffee table, or  foot stool.  Allow your leg muscles to relax, letting gravity straighten out your knee.*  You should feel a stretch behind your right / left knee. Hold this position for __________ seconds. Repeat __________ times. Complete this stretch __________ times per day.  *Your physician, physical therapist or athletic trainer may instruct you place a __________ weight on your thigh, just above your kneecap, to deepen the stretch.  STRENGTHENING EXERCISES - Meniscus Tear, Non-operative, Phase I These exercises may help you when beginning to rehabilitate your injury. They may resolve your symptoms with or without further involvement from your physician, physical therapist or athletic trainer. While completing these exercises, remember:   Muscles can gain both the endurance and the strength needed for everyday activities through controlled exercises.  Complete these exercises as instructed by your physician, physical therapist or athletic trainer. Progress the resistance and repetitions only as guided. STRENGTH - Quadriceps, Isometrics  Lie on your back with your right / left leg extended and your opposite knee bent.  Gradually tense the muscles in the front of your right / left thigh. You should see either your knee cap slide up toward your hip or increased dimpling just above the knee. This motion will push the back of the knee down toward the floor, mat, or bed on which you are lying.  Hold the muscle as tight as you can, without increasing your pain, for __________ seconds.  Relax the muscles slowly and completely between each repetition. Repeat __________ times. Complete this exercise __________ times per day.  STRENGTH - Quadriceps, Short Arcs   Lie on your back. Place a __________ inch towel roll under your right / left knee, so that the knee bends slightly.  Raise only your lower leg by tightening the muscles in the front of your thigh. Do not allow your thigh to rise.  Hold this position  for __________ seconds. Repeat __________ times. Complete this exercise __________ times per day.  OPTIONAL ANKLE WEIGHTS: Begin with ____________________, but DO NOT exceed ____________________. Increase in 1 pound/0.5 kilogram increments. STRENGTH - Quadriceps, Straight Leg Raises  Quality counts! Watch for signs that the quadriceps muscle is working, to be sure you are strengthening the correct muscles and not "cheating" by substituting with healthier muscles.  Lay on your back with your right / left leg extended and your opposite knee bent.  Tense the muscles in the front of your right / left thigh. You should see either your knee cap slide up or increased dimpling just above the knee. Your thigh may even shake a bit.  Tighten these muscles even more and raise your leg 4 to 6 inches off the floor. Hold for __________ seconds.  Keeping these muscles tense, lower your leg.  Relax the muscles slowly and completely in between each repetition. Repeat __________ times. Complete this exercise __________ times per day.  STRENGTH - Hamstring, Curls   Lay on your stomach  with your legs extended. (If you lay on a bed, your feet may hang over the edge.)  Tighten the muscles in the back of your thigh to bend your right / left knee up to 90 degrees. Keep your hips flat on the bed.  Hold this position for __________ seconds.  Slowly lower your leg back to the starting position. Repeat __________ times. Complete this exercise __________ times per day.  STRENGTH - Quadriceps, Squats  Stand in a door frame so that your feet and knees are in line with the frame.  Use your hands for balance, not support, on the frame.  Slowly lower your weight, bending at the hips and knees. Keep your lower legs upright so that they are parallel with the door frame. Squat only within the range that does not increase your knee pain. Never let your hips drop below your knees.  Slowly return upright, pushing with your  legs, not pulling with your hands. Repeat __________ times. Complete this exercise __________ times per day.  STRENGTH - Quad/VMO, Isometric   Sit in a chair with your right / left knee slightly bent. With your fingertips, feel the VMO muscle just above the inside of your knee. The VMO is important in controlling the position of your kneecap.  Keeping your fingertips on this muscle. Without actually moving your leg, attempt to drive your knee down as if straightening your leg. You should feel your VMO tense. If you have a difficult time, you may wish to try the same exercise on your healthy knee first.  Tense this muscle as hard as you can without increasing any knee pain.  Hold for __________ seconds. Relax the muscles slowly and completely in between each repetition. Repeat __________ times. Complete exercise __________ times per day.    This information is not intended to replace advice given to you by your health care provider. Make sure you discuss any questions you have with your health care provider.   Document Released: 09/27/1998 Document Revised: 01/28/2015 Document Reviewed: 12/26/2008 Elsevier Interactive Patient Education [employer]

## 2016-02-02 ENCOUNTER — Ambulatory Visit (INDEPENDENT_AMBULATORY_CARE_PROVIDER_SITE_OTHER): Payer: 59

## 2016-02-02 ENCOUNTER — Ambulatory Visit (INDEPENDENT_AMBULATORY_CARE_PROVIDER_SITE_OTHER): Payer: 59 | Admitting: Family Medicine

## 2016-02-02 VITALS — BP 140/76 | HR 88 | Temp 98.4°F | Resp 16 | Ht 67.0 in | Wt 170.0 lb

## 2016-02-02 DIAGNOSIS — J0100 Acute maxillary sinusitis, unspecified: Secondary | ICD-10-CM

## 2016-02-02 DIAGNOSIS — R6883 Chills (without fever): Secondary | ICD-10-CM | POA: Diagnosis not present

## 2016-02-02 DIAGNOSIS — R05 Cough: Secondary | ICD-10-CM | POA: Diagnosis not present

## 2016-02-02 DIAGNOSIS — R059 Cough, unspecified: Secondary | ICD-10-CM

## 2016-02-02 MED ORDER — HYDROCODONE-HOMATROPINE 5-1.5 MG/5ML PO SYRP: 5.0000 mL | ORAL_SOLUTION | Freq: Three times a day (TID) | ORAL | Status: DC | PRN

## 2016-02-02 MED ORDER — LEVOFLOXACIN 500 MG PO TABS: 500.0000 mg | ORAL_TABLET | Freq: Every day | ORAL | Status: DC

## 2016-02-02 NOTE — Patient Instructions (Addendum)
Please return next Tuesday so we can reevaluate and make sure the infection is completely gone.    IF you received an x-ray today, you will receive an invoice from [facility]. Please contact [facility] at [phone] with questions or concerns regarding your invoice.   IF you received labwork today, you will receive an invoice from [facility]. Please contact [facility] at [phone] with questions or concerns regarding your invoice.   Our billing staff will not be able to assist you with questions regarding bills from these companies.  You will be contacted with the lab results as soon as they are available. The fastest way to get your results is to activate your My Chart account. Instructions are located on the last page of this paperwork. If you have not heard from us regarding the results in 2 weeks, please contact this office.

## 2016-02-02 NOTE — Progress Notes (Signed)
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  No known fever, no nausea or vomiting, no reflux  Objective:  NAD but appears to be acutely ill  BP 140/76 mmHg  Pulse 88  Temp(Src) 98.4 F (36.9 C) (Oral)  Resp 16  Ht 5\' 7"  (1.702 m)  Wt 170 lb (77.111 kg)  BMI 26.62 kg/m2  SpO2 97% HEENT: Marked nasal passage swelling with mucopurulent discharge, oropharynx is red, TMs and canals are normal Neck: Supple no adenopathy or thyromegaly Chest: Diffuse rhonchi bilaterally Heart: Regular no murmur Skin: Warm and dry without rash Extremities: No edema.  Chest x-ray shows coarse bronchial markings with possible left lower lobe infiltrate.  Assessment: Probable early pneumonia  Cough - Plan: DG Chest 2 View, HYDROcodone-homatropine (HYCODAN) 5-1.5 MG/5ML syrup, levofloxacin (LEVAQUIN) 500 MG tablet  Acute maxillary sinusitis, recurrence not specified - Plan: HYDROcodone-homatropine (HYCODAN) 5-1.5 MG/5ML syrup, levofloxacin (LEVAQUIN) 500 MG tablet  Chills - Plan: levofloxacin (LEVAQUIN) 500 MG tablet follow up 8 days Elvina SidleKurt Everado Pillsbury, MD

## 2016-02-03 ENCOUNTER — Telehealth: Payer: Self-pay

## 2016-02-03 DIAGNOSIS — R059 Cough, unspecified: Secondary | ICD-10-CM

## 2016-02-03 DIAGNOSIS — R05 Cough: Secondary | ICD-10-CM

## 2016-02-03 NOTE — Telephone Encounter (Signed)
Pt was seen here on 02/02/16 by Dr. Milus GlazierLauenstein for Cough - Primary. He isn't feeling any better and would like to know what he should do. I told him it would take more time for him to feel better. He wants this message put in. Please advise at [phone]

## 2016-02-04 MED ORDER — PREDNISONE 20 MG PO TABS: ORAL_TABLET | ORAL | Status: DC

## 2016-03-19 ENCOUNTER — Ambulatory Visit (INDEPENDENT_AMBULATORY_CARE_PROVIDER_SITE_OTHER): Payer: 59 | Admitting: Emergency Medicine

## 2016-03-19 VITALS — BP 142/84 | HR 90 | Temp 98.2°F | Resp 17 | Ht 67.0 in | Wt 173.0 lb

## 2016-03-19 DIAGNOSIS — R6889 Other general symptoms and signs: Secondary | ICD-10-CM

## 2016-03-19 DIAGNOSIS — J029 Acute pharyngitis, unspecified: Secondary | ICD-10-CM | POA: Diagnosis not present

## 2016-03-19 DIAGNOSIS — J398 Other specified diseases of upper respiratory tract: Secondary | ICD-10-CM

## 2016-03-19 DIAGNOSIS — J0101 Acute recurrent maxillary sinusitis: Secondary | ICD-10-CM | POA: Diagnosis not present

## 2016-03-19 LAB — POCT RAPID STREP A (OFFICE): RAPID STREP A SCREEN: NEGATIVE

## 2016-03-19 MED ORDER — FLUTICASONE PROPIONATE 50 MCG/ACT NA SUSP: 2.0000 | Freq: Every day | NASAL | Status: DC

## 2016-03-19 MED ORDER — AMOXICILLIN-POT CLAVULANATE 875-125 MG PO TABS: 1.0000 | ORAL_TABLET | Freq: Two times a day (BID) | ORAL | Status: DC

## 2016-03-19 NOTE — Progress Notes (Signed)
By signing my name below, I, Andrew Hurst, attest that this documentation has been prepared under the direction and in the presence of Andrew ChrisSteven Daub, MD.  Electronically Signed: Arvilla MarketMesha Hurst, Medical Scribe. 03/19/2016. 6:56 PM.  Chief Complaint:  Chief Complaint  Patient presents with  . Sore Throat    x1week    HPI: Andrew Hurst is a 70 y.o. male who [facility] today complaining of sore throat onset 1 week. Pt has difficulty swallowing and eating. Pt had PNA in April with congestion, sinus pressure, eye pain, and HA. Pt was examined by Dr. Milus GlazierLauenstein, and [facility].  Pt states that was the sickest he has ever been. Pt states he still has some residual drainage from it with green purulent drainage. Pt states he can taste it in his mouth when it drains in his throat. Pt states the huskyness in his voice has been like that for some time. Pt states he was gargling with Listerine yesterday and today. Pt denies sinus pressure. Pt states he was never a smoked.  Pt went to see Dr. Milus GlazierLauenstein 02/02/16 and had a CXR done that showed some mild bronchial changes otherwise nl- treated with Hycodan, and Levaquin   No past medical history on file. Past Surgical History  Procedure Laterality Date  . Hernia repair     Social History   Social History  . Marital Status: Widowed    Spouse Name: n/a  . Number of Children: 2  . Years of Education: 15   Occupational History  . Sales Associate [employer]   Social History Main Topics  . Smoking status: Never Smoker   . Smokeless tobacco: Never Used  . Alcohol Use: No  . Drug Use: No  . Sexual Activity:    Partners: Female    Pharmacist, hospitalBirth Control/ Protection: Condom     Comment: "not that much"   Other Topics Concern  . None   Social History Narrative   Lives alone. Widowed. Wife died in [id number] of metastatic breast cancer at age 72 years. Education: college. Pt. Does exercise. [place]  [place] child in [place].   Family History  Problem Relation Age of Onset  . Kidney disease Mother   . COPD Father   . Cancer Brother [id number]    colon cancer   No Known Allergies Prior to Admission medications   Medication Sig Start Date End Date Taking? Authorizing Provider  meloxicam (MOBIC) 15 MG tablet Take 1 tablet (15 mg total) by mouth daily. 01/17/16  Yes Sherren MochaEva N Shaw, MD  tamsulosin (FLOMAX) 0.4 MG CAPS capsule Take 0.4 mg by mouth. 3 times a week   Yes Historical Provider, MD  HYDROcodone-homatropine (HYCODAN) 5-1.5 MG/5ML syrup Take 5 mLs by mouth every 8 (eight) hours as needed for cough. Patient not taking: Reported on 03/19/2016 02/02/16   Elvina SidleKurt Lauenstein, MD  levofloxacin (LEVAQUIN) 500 MG tablet Take 1 tablet (500 mg total) by mouth daily. Patient not taking: Reported on 03/19/2016 02/02/16   Elvina SidleKurt Lauenstein, MD  predniSONE (DELTASONE) 20 MG tablet Two daily with food Patient not taking: Reported on 03/19/2016 02/04/16   Elvina SidleKurt Lauenstein, MD     ROS: The patient denies fevers, chills, night sweats, unintentional weight loss, chest pain, palpitations, wheezing, dyspnea on exertion, nausea, vomiting, abdominal pain, dysuria, hematuria, melena, numbness, weakness, or tingling.   All other systems have been reviewed and were otherwise negative with the exception of those mentioned in  the HPI and as above.    PHYSICAL EXAM: Filed Vitals:   03/19/16 1813  BP: 142/84  Pulse: 90  Temp: 98.2 F (36.8 C)  Resp: 17   Body mass index is 27.09 kg/(m^2).   General: Alert, no acute distress HEENT:  Normocephalic, atraumatic, oropharynx patent. Mild redness in his throat. Questionable mucus over the left side of the throat vs a mucus patch Eye: EOMI, PEERLDC Cardiovascular:  Regular rate and rhythm, no rubs murmurs or gallops.  No Carotid bruits, radial pulse intact. No pedal edema.  Respiratory: Clear to auscultation bilaterally.  No wheezes, rales, or rhonchi.  No cyanosis, no use of  accessory musculature Abdominal: No organomegaly, abdomen is soft and non-tender, positive bowel sounds.  No masses. Musculoskeletal: Gait intact. No edema, tenderness Skin: No rashes. Neurologic: Facial musculature symmetric. Psychiatric: Patient acts appropriately throughout our interaction. Lymphatic: No cervical or submandibular lymphadenopathy  LABS: Results for orders placed or performed in visit on 03/19/16  POCT rapid strep A  Result Value Ref Range   Rapid Strep A Screen Negative Negative   EKG/XRAY:   Primary read interpreted by [facility]ub [facility].   ASSESSMENT/PLAN: Will treat as an active sinusitis with Augmentin and Flonase. There is a whitish area on the left side of the throat which is 5 mm in size. It has some of the appearances of leukoplakia. Referral will be made to ENT to ensure this is not leukoplakia. I personally performed the services described in this documentation, which was scribed in my presence. The recorded information has been reviewed and is accurate.    Gross sideeffects, risk and benefits, and alternatives of medications d/w patient. Patient is aware that all medications have potential sideeffects and we are unable to predict every sideeffect or drug-drug interaction that may occur.  Andrew ChrisSteven Daub MD 03/19/2016 6:56 PM

## 2016-03-19 NOTE — Patient Instructions (Addendum)
   IF you received an x-ray today, you will receive an invoice from [facility]. Please contact [facility] at [phone] with questions or concerns regarding your invoice.   IF you received labwork today, you will receive an invoice from [facility]. Please contact [facility] at [phone] with questions or concerns regarding your invoice.   Our billing staff will not be able to assist you with questions regarding bills from these companies.  You will be contacted with the lab results as soon as they are available. The fastest way to get your results is to activate your My Chart account. Instructions are located on the last page of this paperwork. If you have not heard from us regarding the results in 2 weeks, please contact this office.     Sinusitis, Adult Sinusitis is redness, soreness, and inflammation of the paranasal sinuses. Paranasal sinuses are air pockets within the bones of your face. They are located beneath your eyes, in the middle of your forehead, and above your eyes. In healthy paranasal sinuses, mucus is able to drain out, and air is able to circulate through them by way of your nose. However, when your paranasal sinuses are inflamed, mucus and air can become trapped. This can allow bacteria and other germs to grow and cause infection. Sinusitis can develop quickly and last only a short time (acute) or continue over a long period (chronic). Sinusitis that lasts for more than 12 weeks is considered chronic. CAUSES Causes of sinusitis include:  Allergies.  Structural abnormalities, such as displacement of the cartilage that separates your nostrils (deviated septum), which can decrease the air flow through your nose and sinuses and affect sinus drainage.  Functional abnormalities, such as when the small hairs (cilia) that line your sinuses and help remove mucus do not work properly or are not present. SIGNS AND SYMPTOMS Symptoms  of acute and chronic sinusitis are the same. The primary symptoms are pain and pressure around the affected sinuses. Other symptoms include:  Upper toothache.  Earache.  Headache.  Bad breath.  Decreased sense of smell and taste.  A cough, which worsens when you are lying flat.  Fatigue.  Fever.  Thick drainage from your nose, which often is green and may contain pus (purulent).  Swelling and warmth over the affected sinuses. DIAGNOSIS Your health care provider will perform a physical exam. During your exam, your health care provider may perform any of the following to help determine if you have acute sinusitis or chronic sinusitis:  Look in your nose for signs of abnormal growths in your nostrils (nasal polyps).  Tap over the affected sinus to check for signs of infection.  View the inside of your sinuses using an imaging device that has a light attached (endoscope). If your health care provider suspects that you have chronic sinusitis, one or more of the following tests may be recommended:  Allergy tests.  Nasal culture. A sample of mucus is taken from your nose, sent to a lab, and screened for bacteria.  Nasal cytology. A sample of mucus is taken from your nose and examined by your health care provider to determine if your sinusitis is related to an allergy. TREATMENT Most cases of acute sinusitis are related to a viral infection and will resolve on their own within 10 days. Sometimes, medicines are prescribed to help relieve symptoms of both acute and chronic sinusitis. These may include pain medicines, decongestants, nasal steroid sprays, or saline sprays. However, for sinusitis related   to a bacterial infection, your health care provider will prescribe antibiotic medicines. These are medicines that will help kill the bacteria causing the infection. Rarely, sinusitis is caused by a fungal infection. In these cases, your health care provider will prescribe antifungal  medicine. For some cases of chronic sinusitis, surgery is needed. Generally, these are cases in which sinusitis recurs more than 3 times per year, despite other treatments. HOME CARE INSTRUCTIONS  Drink plenty of water. Water helps thin the mucus so your sinuses can drain more easily.  Use a humidifier.  Inhale steam 3-4 times a day (for example, sit in the bathroom with the shower running).  Apply a warm, moist washcloth to your face 3-4 times a day, or as directed by your health care provider.  Use saline nasal sprays to help moisten and clean your sinuses.  Take medicines only as directed by your health care provider.  If you were prescribed either an antibiotic or antifungal medicine, finish it all even if you start to feel better. SEEK IMMEDIATE MEDICAL CARE IF:  You have increasing pain or severe headaches.  You have nausea, vomiting, or drowsiness.  You have swelling around your face.  You have vision problems.  You have a stiff neck.  You have difficulty breathing.   This information is not intended to replace advice given to you by your health care provider. Make sure you discuss any questions you have with your health care provider.   Document Released: 09/13/2005 Document Revised: 10/04/2014 Document Reviewed: 09/28/2011 Elsevier Interactive Patient Education 2016 Elsevier Inc.  

## 2016-03-21 LAB — CULTURE, GROUP A STREP

## 2016-03-26 ENCOUNTER — Encounter: Payer: Self-pay | Admitting: *Deleted

## 2016-05-28 ENCOUNTER — Other Ambulatory Visit: Payer: Self-pay | Admitting: Family Medicine

## 2016-07-28 ENCOUNTER — Ambulatory Visit (INDEPENDENT_AMBULATORY_CARE_PROVIDER_SITE_OTHER): Payer: 59 | Admitting: Family Medicine

## 2016-07-28 VITALS — BP 136/68 | HR 69 | Temp 98.0°F | Resp 18 | Ht 67.0 in | Wt 173.2 lb

## 2016-07-28 DIAGNOSIS — B351 Tinea unguium: Secondary | ICD-10-CM | POA: Diagnosis not present

## 2016-07-28 DIAGNOSIS — Z139 Encounter for screening, unspecified: Secondary | ICD-10-CM

## 2016-07-28 LAB — HEPATIC FUNCTION PANEL
ALBUMIN: 3.9 g/dL (ref 3.6–5.1)
ALT: 12 U/L (ref 9–46)
AST: 14 U/L (ref 10–35)
Alkaline Phosphatase: 48 U/L (ref 40–115)
BILIRUBIN DIRECT: 0.1 mg/dL (ref <=0.2)
Indirect Bilirubin: 0.6 mg/dL (ref 0.2–1.2)
TOTAL PROTEIN: 5.6 g/dL — AB (ref 6.1–8.1)
Total Bilirubin: 0.7 mg/dL (ref 0.2–1.2)

## 2016-07-28 MED ORDER — TERBINAFINE HCL 250 MG PO TABS: 250.0000 mg | ORAL_TABLET | Freq: Every day | ORAL | 0 refills | Status: DC

## 2016-07-28 NOTE — Patient Instructions (Addendum)
I will follow-up with you regarding your liver function labs. Your prescription for Lamisil 250 mg daily for 12 weeks has been sent over but do not start until liver function lab results are communicated to you.  Andrew PickKimberly S. Tiburcio PeaHarris, MSN, FNP-C [facility]  IF you received an x-ray today, you will receive an invoice from [facility]. Please contact [facility] at [phone] with questions or concerns regarding your invoice.   IF you received labwork today, you will receive an invoice from [facility]. Please contact [facility] at [phone] with questions or concerns regarding your invoice.   Our billing staff will not be able to assist you with questions regarding bills from these companies.  You will be contacted with the lab results as soon as they are available. The fastest way to get your results is to activate your My Chart account. Instructions are located on the last page of this paperwork. If you have not heard from us regarding the results in 2 weeks, please contact this office.     Toe Preventing Toenail Fungus from Recurring   Sanitize your shoes with Mycomist spray or a similar shoe sanitizer spray.  Follow the instructions on the bottle and dry them outside in the sun or with a hairdryer.  We also recommend repeating the sanitization once weekly in shoes you wear most often.   Throw away any shoes you have worn a significant amount without socks-fungus thrives in a warm moist environment and you want to avoid re-infection after your laser procedure   Bleach your socks with regular or color safe bleach   Change your socks regularly to keep your feet clean and dry (especially if you have sweaty feet)-if sweaty feet are a problem, let your doctor know-there is a great lotion that helps with this problem.   Clean your toenail clippers with alcohol before you use them if you do your own  toenails and make sure to replace Emory boards and orange sticks regularly  If you get regular pedicures, bring your own instruments or go to a spa that sterilizes their instruments in an autoclave.

## 2016-07-28 NOTE — Progress Notes (Signed)
   Patient ID: Andrew Hurst, male    DOB: 1945-10-06, 70 y.o.   MRN: [id number]  PCP: No PCP Per Patient  Chief Complaint  Patient presents with  . Nail Problem    Located on both big toes.     Subjective:   HPI 70 year old male presents for evaluation of bilateral toe nail fungus times 6-7 months.  [facility] Reports that fungus was originally localized to the great toe on the left foot and is now on the right first three toes. He attributes the development of fungus to wearing old tennis shoes. Denies an Has tried over the counter anti-fungal treatments without relief.    Review of Systems See HPI   Patient Active Problem List   Diagnosis Date Noted  . BPH (benign prostatic hypertrophy) 07/17/2014  . Hearing loss of left ear 07/17/2014  . Hyperglycemia 07/17/2014     Prior to Admission medications   Medication Sig Start Date End Date Taking? Authorizing Provider  tamsulosin (FLOMAX) 0.4 MG CAPS capsule Take 0.4 mg by mouth. 3 times a week   Yes Historical Provider, MD  fluticasone (FLONASE) 50 MCG/ACT nasal spray Place 2 sprays into both nostrils daily. Patient not taking: Reported on 07/28/2016 03/19/16   Collene GobbleSteven A Daub, MD  meloxicam (MOBIC) 15 MG tablet Take 1 tablet (15 mg total) by mouth daily. 01/17/16   Sherren MochaEva N Shaw, MD  predniSONE (DELTASONE) 20 MG tablet Two daily with food Patient not taking: Reported on 07/28/2016 02/04/16   Elvina SidleKurt Lauenstein, MD    No Known Allergies     Objective:  Physical Exam  Constitutional: He appears well-developed and well-nourished.  HENT:  Head: Normocephalic and atraumatic.  Right Ear: External ear normal.  Left Ear: External ear normal.  Mouth/Throat: Oropharynx is clear and moist.  Eyes: Conjunctivae and EOM are normal. Pupils are equal, round, and reactive to light.  Neck: Normal range of motion. Neck supple.  Skin: Skin is warm and dry.     Psychiatric: He has a normal mood and affect. His behavior is normal.  Judgment and thought content normal.    Vitals:   07/28/16 0816  BP: 136/68  Pulse: 69  Resp: 18  Temp: 98 F (36.7 C)   Assessment & Plan:  1. Toenail fungus -Screen hepatic function -If hepatic function stable, start Terbutifine (Lamisil) 250 mg daily x 12 weeks.  2. Screening procedure - Hepatic Function Panel  Return in 8 weeks after taking medication for hepatic function recheck.  Godfrey PickKimberly S. Tiburcio PeaHarris, MSN, FNP-C [facility]

## 2016-07-29 ENCOUNTER — Telehealth: Payer: Self-pay | Admitting: Family Medicine

## 2016-07-29 NOTE — Telephone Encounter (Signed)
Left a voicemail that his lab work was normal and he can pickup his terbinafine (Lamisil) 250 mg which was ordered yesterday. He will need to take medication for toe nail fungus for 12 weeks and return in 8 weeks to recheck hepatic function.  Andrew PickKimberly S. Tiburcio PeaHarris, MSN, FNP-C [facility]

## 2016-10-10 ENCOUNTER — Other Ambulatory Visit: Payer: Self-pay | Admitting: Family Medicine

## 2016-10-13 NOTE — Telephone Encounter (Signed)
01/17/16 last refill 07/2016 last ov

## 2016-10-14 NOTE — Telephone Encounter (Signed)
Medication refilled for one month. Any additional refills for chronic knee pain requires an OV.  Godfrey PickKimberly S. Tiburcio PeaHarris, MSN, FNP-C Primary Care at [facility] [phone]

## 2016-10-18 NOTE — Telephone Encounter (Signed)
L/m with kims response

## 2016-10-19 ENCOUNTER — Other Ambulatory Visit: Payer: Self-pay | Admitting: Orthopedic Surgery

## 2016-10-19 DIAGNOSIS — M25561 Pain in right knee: Secondary | ICD-10-CM

## 2016-10-19 DIAGNOSIS — M7121 Synovial cyst of popliteal space [Baker], right knee: Secondary | ICD-10-CM

## 2016-10-20 ENCOUNTER — Other Ambulatory Visit: Payer: Self-pay | Admitting: Radiation Oncology

## 2016-10-27 ENCOUNTER — Other Ambulatory Visit: Payer: Self-pay | Admitting: Orthopedic Surgery

## 2016-10-27 ENCOUNTER — Ambulatory Visit
Admission: RE | Admit: 2016-10-27 | Discharge: 2016-10-27 | Disposition: A | Discharge status: Home / Self Care | Payer: Self-pay | Source: Ambulatory Visit | Attending: Orthopedic Surgery | Admitting: Orthopedic Surgery

## 2016-10-27 DIAGNOSIS — M7121 Synovial cyst of popliteal space [Baker], right knee: Secondary | ICD-10-CM

## 2016-10-27 DIAGNOSIS — M25561 Pain in right knee: Secondary | ICD-10-CM

## 2017-04-25 IMAGING — US US EXTREM LOW*R* LIMITED
1 series · 7 of 7 positions shown · non-contrast
Comparison: 01/17/2016

CLINICAL DATA: Posterior knee swelling, pain, possible Baker cyst

EXAM:
ULTRASOUND RIGHT LOWER EXTREMITY LIMITED
TECHNIQUE: Ultrasound examination of the lower extremity soft tissues was
performed in the area of clinical concern.

[Series 1: us extrem low*right* limited · 0.08mm/px · 7 of 7 slices shown]
[im 1/7]
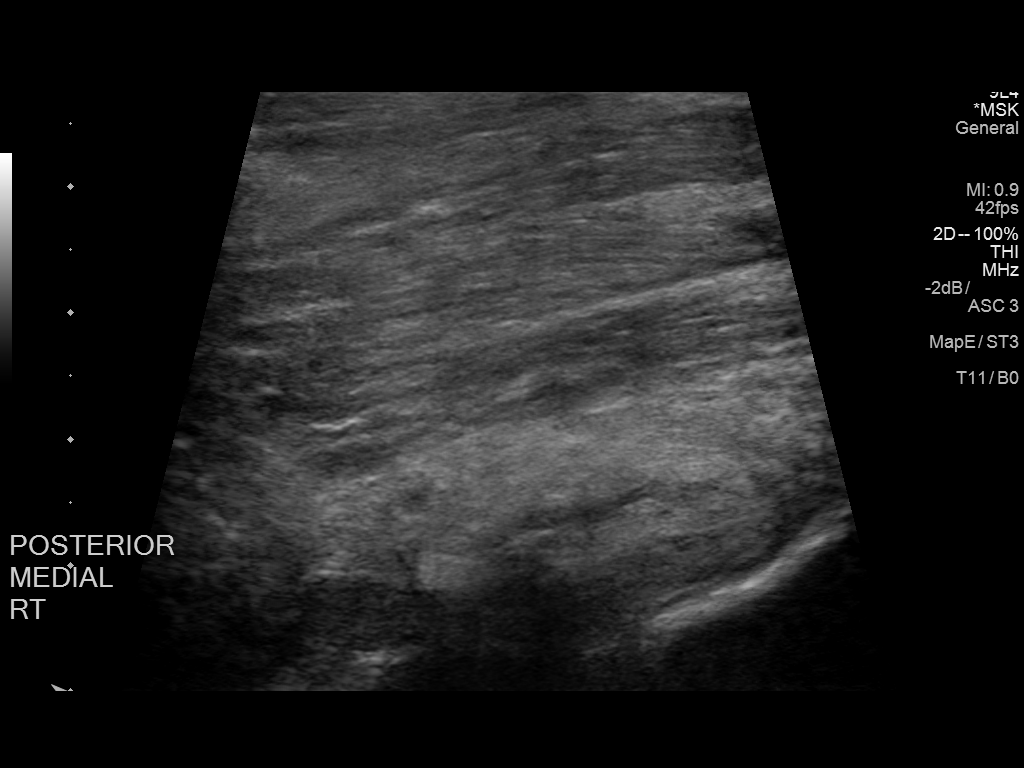
[im 2/7]
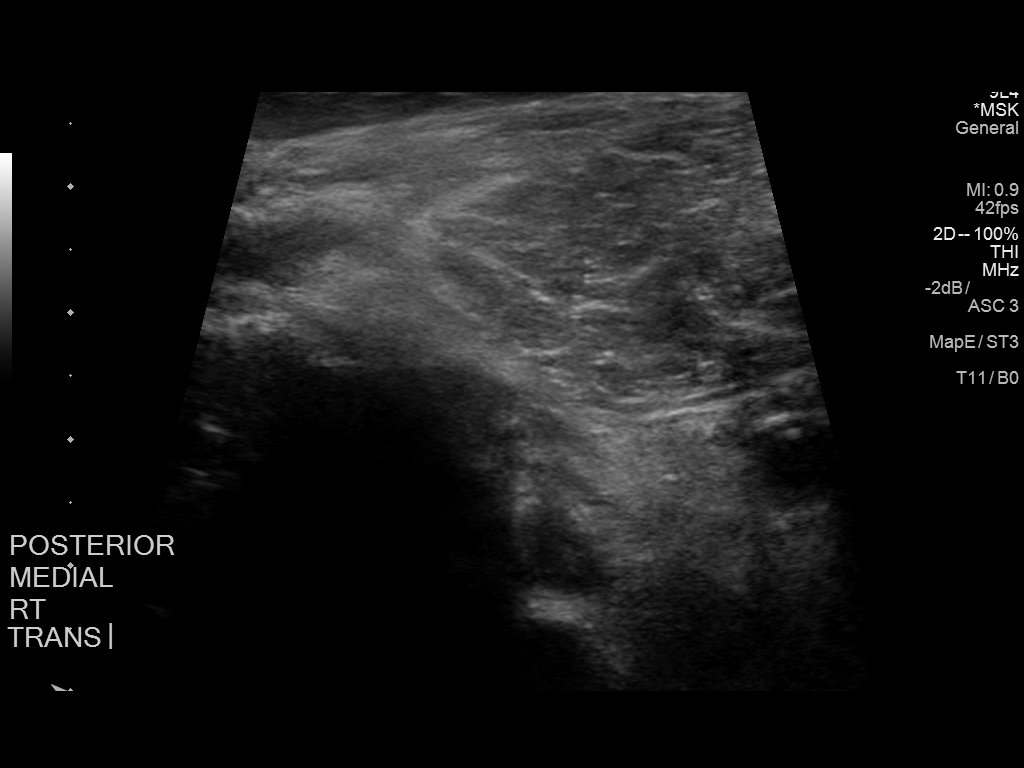
[im 3/7]
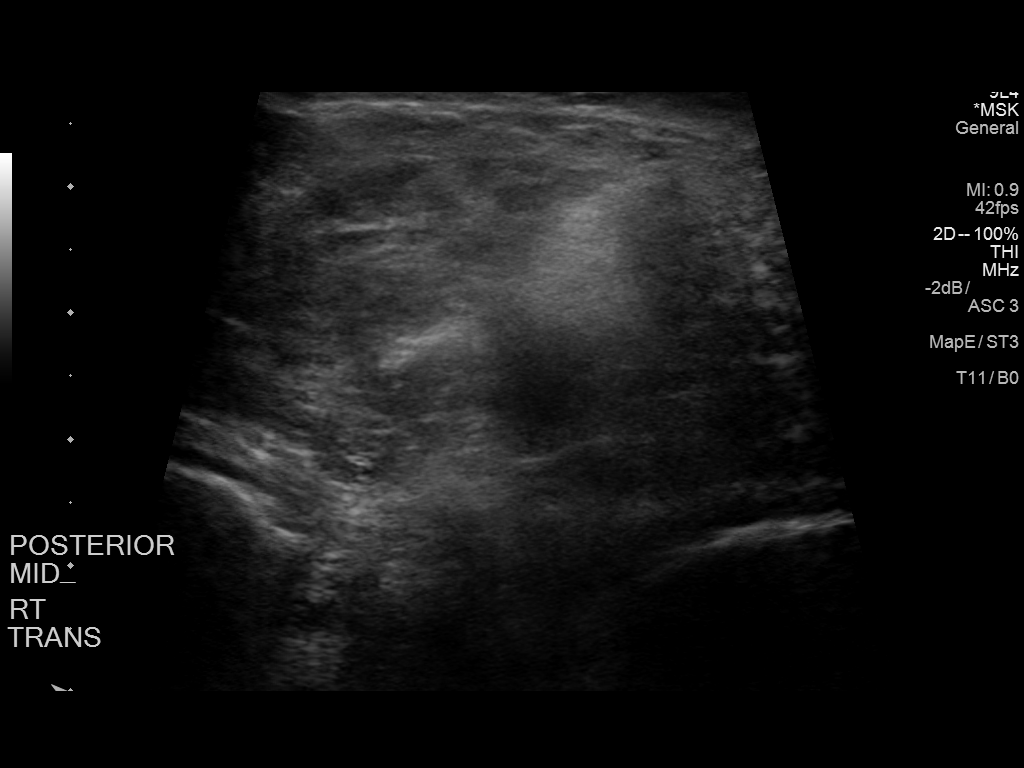
[im 4/7]
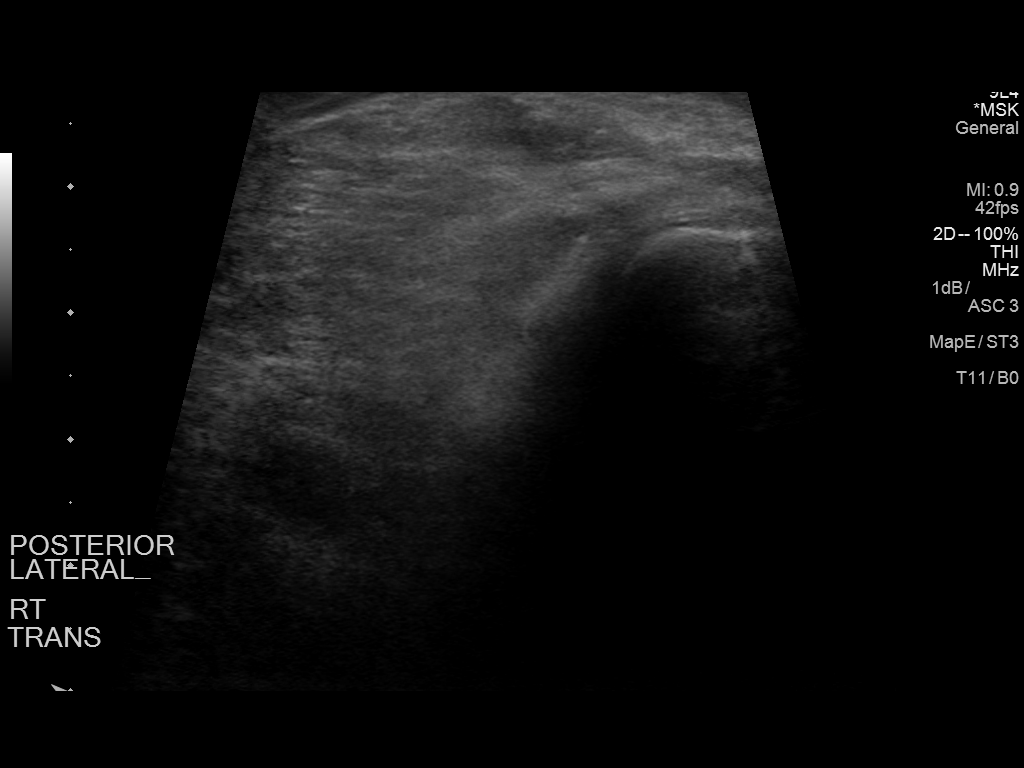
[im 5/7]
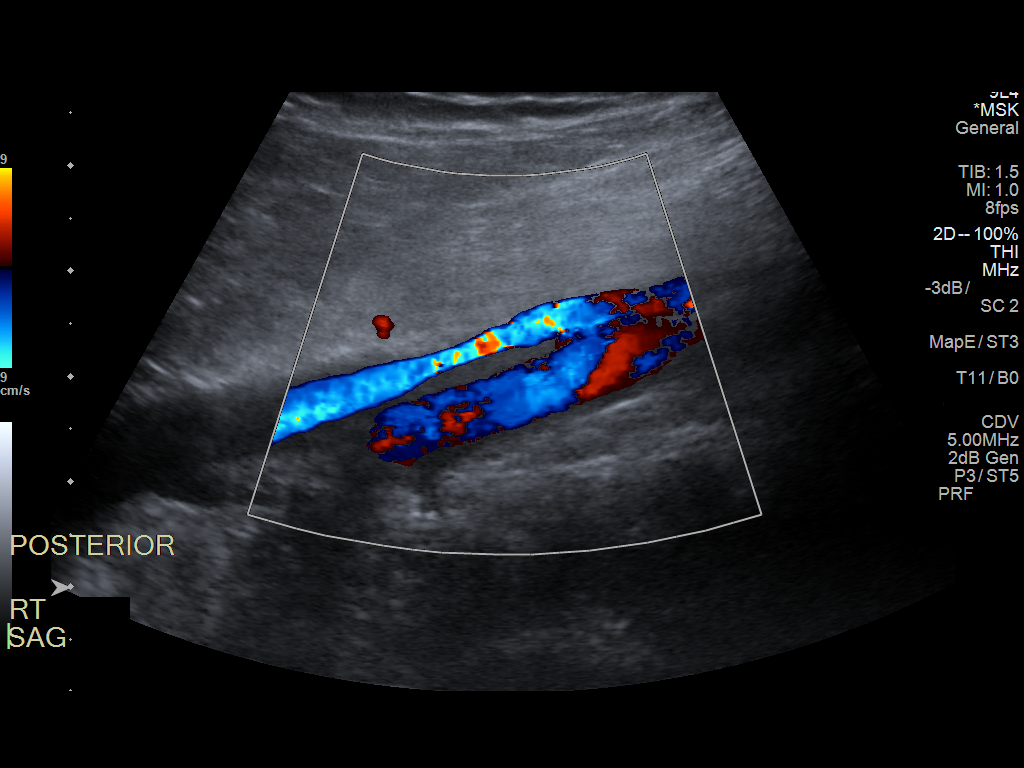
[im 6/7]
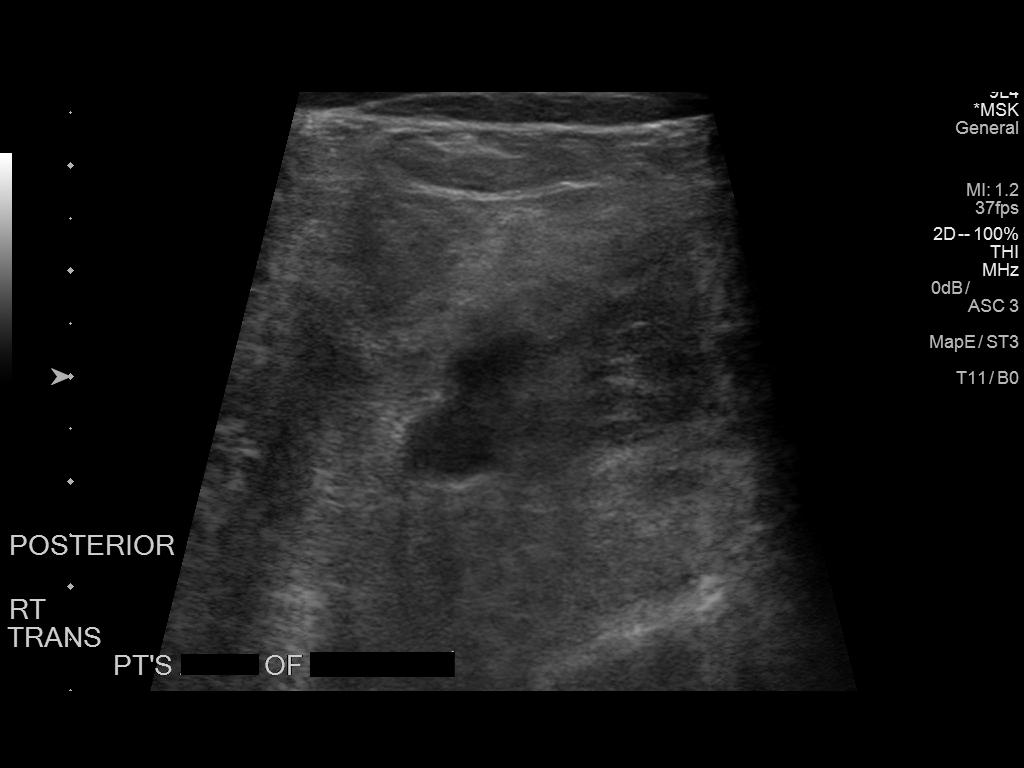
[im 7/7]
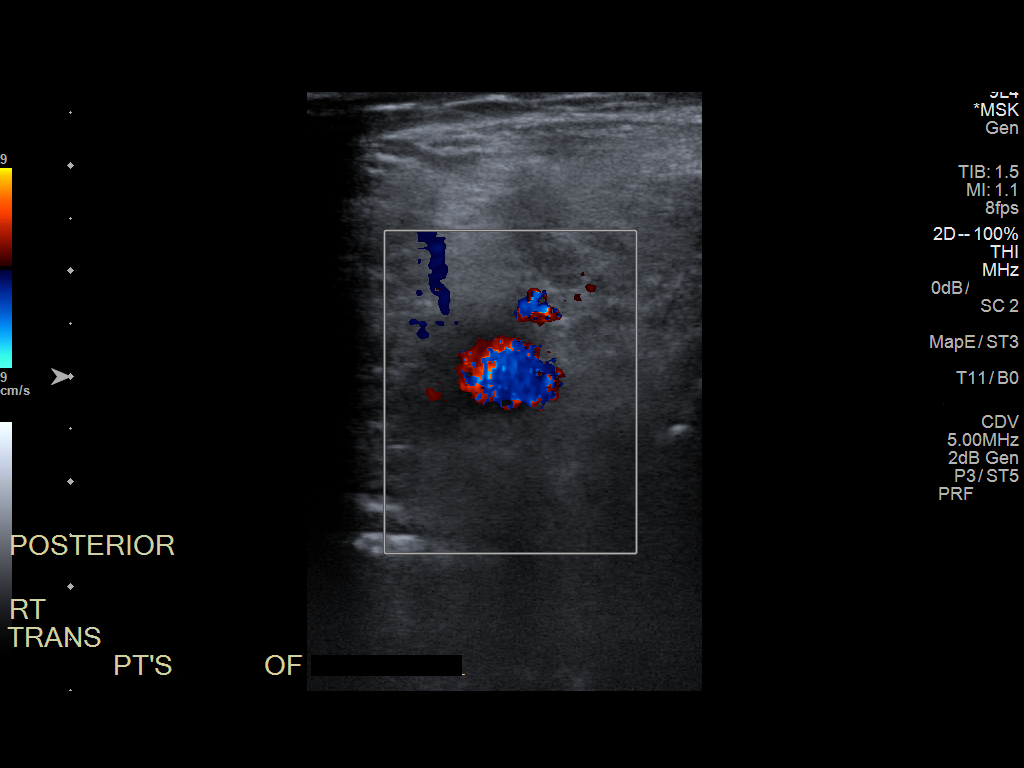

[7 of 7 positions shown; findings below may reference images not displayed]

FINDINGS: Joint Space: No effusion.

Muscles: No gross abnormality

Tendons: No gross abnormality

Other Soft Tissue Structures: No visualized Bakers cyst therefore
aspiration not warranted
IMPRESSION: Negative limited right extremity ultrasound for bakers cyst.

## 2017-06-15 ENCOUNTER — Encounter: Payer: Self-pay | Admitting: Physician Assistant

## 2017-06-15 ENCOUNTER — Ambulatory Visit (INDEPENDENT_AMBULATORY_CARE_PROVIDER_SITE_OTHER): Payer: 59 | Admitting: Physician Assistant

## 2017-06-15 VITALS — BP 139/77 | HR 86 | Temp 98.3°F | Resp 16 | Ht 67.0 in | Wt 172.6 lb

## 2017-06-15 DIAGNOSIS — N410 Acute prostatitis: Secondary | ICD-10-CM | POA: Diagnosis not present

## 2017-06-15 DIAGNOSIS — R5383 Other fatigue: Secondary | ICD-10-CM | POA: Diagnosis not present

## 2017-06-15 LAB — POCT CBC
GRANULOCYTE PERCENT: 82.4 % — AB (ref 37–80)
HEMATOCRIT: 44.9 % (ref 43.5–53.7)
Hemoglobin: 15.5 g/dL (ref 14.1–18.1)
Lymph, poc: 1 (ref 0.6–3.4)
MCH: 33.2 pg — AB (ref 27–31.2)
MCHC: 34.5 g/dL (ref 31.8–35.4)
MCV: 96.4 fL (ref 80–97)
MID (CBC): 0.3 (ref 0–0.9)
MPV: 6.8 fL (ref 0–99.8)
POC Granulocyte: 6.3 (ref 2–6.9)
POC LYMPH %: 13.6 % (ref 10–50)
POC MID %: 4 % (ref 0–12)
Platelet Count, POC: 160 10*3/uL (ref 142–424)
RBC: 4.65 M/uL — AB (ref 4.69–6.13)
RDW, POC: 13.3 %
WBC: 7.6 10*3/uL (ref 4.6–10.2)

## 2017-06-15 LAB — POCT URINALYSIS DIP (MANUAL ENTRY)
Blood, UA: NEGATIVE
Glucose, UA: NEGATIVE mg/dL
LEUKOCYTES UA: NEGATIVE
NITRITE UA: NEGATIVE
PH UA: 5.5 (ref 5.0–8.0)
Spec Grav, UA: 1.02 (ref 1.010–1.025)
Urobilinogen, UA: 1 E.U./dL

## 2017-06-15 LAB — GLUCOSE, POCT (MANUAL RESULT ENTRY): POC Glucose: 131 mg/dl — AB (ref 70–99)

## 2017-06-15 MED ORDER — SULFAMETHOXAZOLE-TRIMETHOPRIM 800-160 MG PO TABS: 1.0000 | ORAL_TABLET | Freq: Two times a day (BID) | ORAL | 0 refills | Status: DC

## 2017-06-15 NOTE — Progress Notes (Signed)
Patient ID: Andrew Hurst, male    DOB: [birth year], 71 y.o.   MRN: [id number]  PCP: Patient, No Pcp Per  Chief Complaint  Patient presents with  . Fatigue    depressed, no energy, achey, feel sleepy all the time started yesterday     Subjective:   Presents for evaluation of fatigue, lethargy.  I have seen him once previously, in 01-29-2014, for a Wellness Exam. In the interim, he has seen several of my colleagues for acute illnesses, and has been followed at [facility] for BPH with elevated PSA, last visit 03/29/2017.  He was in his usual state of health until yesterday, when he awoke feeling lethargic and weak. He spent most of the day in bed. No specific trigger or identified associated symptoms. Today he feels a little better.  He has experienced symptoms like this previously, in Jan 30, 1984 following his wife's death from cancer. He recently sent his son money as a birthday gift and found out that he spent it on "partying," and got into some trouble as a result. That has been disappointing and upsetting to the patient, but he doesn't relate it to his current symptoms.  Associated symptoms include vague headache, mild dizziness (no spinning), mild generalized achiness, mild lower abdominal pain yesterday associated with constipation which is now resolved. He has no CP, SOB, palpitations, diaphoresis. No fever, chills. No urinary urgency, frequency or burning. No hematuria. No cough, sore throat, congestion. No melena or hematochezia.    Review of Systems As above.    Patient Active Problem List   Diagnosis Date Noted  . BPH (benign prostatic hypertrophy) 07/17/2014  . Hearing loss of left ear 07/17/2014  . Hyperglycemia 07/17/2014  . Elevated PSA, less than 10 ng/ml 10/29/2013     Prior to Admission medications   Medication Sig Start Date End Date Taking? Authorizing Provider  fluticasone (FLONASE) 50 MCG/ACT nasal spray Place 2 sprays into both nostrils daily. 03/19/16  Yes  Collene Gobble, MD  tamsulosin (FLOMAX) 0.4 MG CAPS capsule Take 0.4 mg by mouth. 3 times a week   Yes [provider]     No Known Allergies     Objective:  Physical Exam  Constitutional: He is oriented to person, place, and time. He appears well-developed and well-nourished. He appears lethargic. He is active and cooperative. No distress.  BP 139/77   Pulse 86   Temp 98.3 F (36.8 C)   Resp 16   Ht  (1.702 m)   Wt 172 lb 9.6 oz (78.3 kg)   SpO2 97%   BMI 27.03 kg/m  He is lying down on the exam table when I enter the room, and reclines again when I exit.  HENT:  Head: Normocephalic and atraumatic.  Right Ear: Hearing normal.  Left Ear: Hearing normal.  Eyes: Conjunctivae are normal. No scleral icterus.  Neck: Normal range of motion. Neck supple. No thyromegaly present.  Cardiovascular: Normal rate, regular rhythm and normal heart sounds.   Pulses:      Radial pulses are 2+ on the right side, and 2+ on the left side.  Pulmonary/Chest: Effort normal and breath sounds normal.  Lymphadenopathy:       Head (right side): No tonsillar, no preauricular, no posterior auricular and no occipital adenopathy present.       Head (left side): No tonsillar, no preauricular, no posterior auricular and no occipital adenopathy present.    He has no cervical adenopathy.  Right: No supraclavicular adenopathy present.       Left: No supraclavicular adenopathy present.  Neurological: He is oriented to person, place, and time. He has normal strength. He appears lethargic. No sensory deficit.  Reflex Scores:      Bicep reflexes are 2+ on the right side and 2+ on the left side.      Patellar reflexes are 2+ on the right side and 2+ on the left side.      Achilles reflexes are 2+ on the right side and 2+ on the left side. Initially seemed confused about time, but recognized it and answered questions correctly.  Skin: Skin is warm, dry and intact. No rash noted. No cyanosis or  erythema. Nails show no clubbing.  Psychiatric: He has a normal mood and affect. His speech is normal and behavior is normal.       Results for orders placed or performed in visit on 06/15/17  POCT urinalysis dipstick  Result Value Ref Range   Color, UA yellow yellow   Clarity, UA clear clear   Glucose, UA negative negative mg/dL   Bilirubin, UA small (A) negative   Ketones, POC UA small (15) (A) negative mg/dL   Spec Grav, UA 1.610 9.604 - 1.025   Blood, UA negative negative   pH, UA 5.5 5.0 - 8.0   Protein Ur, POC =30 (A) negative mg/dL   Urobilinogen, UA 1.0 0.2 or 1.0 E.U./dL   Nitrite, UA Negative Negative   Leukocytes, UA Negative Negative  POCT CBC  Result Value Ref Range   WBC 7.6 4.6 - 10.2 K/uL   Lymph, poc 1.0 0.6 - 3.4   POC LYMPH PERCENT 13.6 10 - 50 %L   MID (cbc) 0.3 0 - 0.9   POC MID % 4.0 0 - 12 %M   POC Granulocyte 6.3 2 - 6.9   Granulocyte percent 82.4 (A) 37 - 80 %G   RBC 4.65 (A) 4.69 - 6.13 M/uL   Hemoglobin 15.5 14.1 - 18.1 g/dL   HCT, POC 54.0 98.1 - 53.7 %   MCV 96.4 80 - 97 fL   MCH, POC 33.2 (A) 27 - 31.2 pg   MCHC 34.5 31.8 - 35.4 g/dL   RDW, POC 19.1 %   Platelet Count, POC 160 142 - 424 K/uL   MPV 6.8 0 - 99.8 fL  POCT glucose (manual entry)  Result Value Ref Range   POC Glucose 131 (A) 70 - 99 mg/dl    Orthostatic VS for the past 24 hrs:  BP- Lying Pulse- Lying BP- Sitting Pulse- Sitting BP- Standing at 0 minutes Pulse- Standing at 0 minutes  06/15/17 1010 139/79 83 136/75 82 146/77 96         Assessment & Plan:   1. Fatigue, unspecified type Unclear etiology. Considering acute stress reaction, but given history of BPH, also considering prostatitis. Elect to initiate treatment for prostatitis while awaiting remaining lab results, and plan close follow-up. - TSH - POCT urinalysis dipstick - POCT CBC - POCT glucose (manual entry) - Comprehensive metabolic panel - Orthostatic vital signs  2. Acute prostatitis -  sulfamethoxazole-trimethoprim (BACTRIM DS,SEPTRA DS) 800-160 MG tablet; Take 1 tablet by mouth 2 (two) times daily.  Dispense: 20 tablet; Refill: 0    Return in about 1 week (around 06/22/2017) for re-evalaution of fatigue, administration of flu vaccine.   Fernande Bras, PA-C Primary Care at [facility]

## 2017-06-15 NOTE — Patient Instructions (Signed)
     IF you received an x-ray today, you will receive an invoice from [facility]. Please contact [facility] at [phone] with questions or concerns regarding your invoice.   IF you received labwork today, you will receive an invoice from [facility]. Please contact [facility] at [phone] with questions or concerns regarding your invoice.   Our billing staff will not be able to assist you with questions regarding bills from these companies.  You will be contacted with the lab results as soon as they are available. The fastest way to get your results is to activate your My Chart account. Instructions are located on the last page of this paperwork. If you have not heard from us regarding the results in 2 weeks, please contact this office.     

## 2017-06-16 LAB — COMPREHENSIVE METABOLIC PANEL
ALBUMIN: 3.8 g/dL (ref 3.5–4.8)
ALK PHOS: 54 IU/L (ref 39–117)
ALT: 16 IU/L (ref 0–44)
AST: 20 IU/L (ref 0–40)
Albumin/Globulin Ratio: 2 (ref 1.2–2.2)
BILIRUBIN TOTAL: 0.8 mg/dL (ref 0.0–1.2)
BUN / CREAT RATIO: 22 (ref 10–24)
BUN: 22 mg/dL (ref 8–27)
CO2: 25 mmol/L (ref 20–29)
Calcium: 8.2 mg/dL — ABNORMAL LOW (ref 8.6–10.2)
Chloride: 102 mmol/L (ref 96–106)
Creatinine, Ser: 0.98 mg/dL (ref 0.76–1.27)
GFR calc Af Amer: 88 mL/min/{1.73_m2} (ref >59)
GFR calc non Af Amer: 76 mL/min/{1.73_m2} (ref >59)
Globulin, Total: 1.9 g/dL (ref 1.5–4.5)
Glucose: 120 mg/dL — ABNORMAL HIGH (ref 65–99)
Potassium: 3.9 mmol/L (ref 3.5–5.2)
SODIUM: 140 mmol/L (ref 134–144)
Total Protein: 5.7 g/dL — ABNORMAL LOW (ref 6.0–8.5)

## 2017-06-16 LAB — TSH: TSH: 1.27 u[IU]/mL (ref 0.450–4.500)

## 2017-09-23 ENCOUNTER — Encounter: Payer: Self-pay | Admitting: Physician Assistant

## 2017-09-23 DIAGNOSIS — Z86010 Personal history of colonic polyps: Secondary | ICD-10-CM | POA: Insufficient documentation

## 2017-12-05 ENCOUNTER — Ambulatory Visit (INDEPENDENT_AMBULATORY_CARE_PROVIDER_SITE_OTHER): Payer: 59 | Admitting: Family Medicine

## 2017-12-05 ENCOUNTER — Encounter: Payer: Self-pay | Admitting: Family Medicine

## 2017-12-05 ENCOUNTER — Other Ambulatory Visit: Payer: Self-pay

## 2017-12-05 VITALS — BP 146/77 | HR 79 | Temp 98.1°F | Resp 16 | Ht 67.0 in | Wt 173.2 lb

## 2017-12-05 DIAGNOSIS — Z5181 Encounter for therapeutic drug level monitoring: Secondary | ICD-10-CM

## 2017-12-05 DIAGNOSIS — B351 Tinea unguium: Secondary | ICD-10-CM | POA: Diagnosis not present

## 2017-12-05 MED ORDER — CICLOPIROX 8 % EX SOLN: Freq: Every day | CUTANEOUS | 3 refills | Status: DC

## 2017-12-05 MED ORDER — TERBINAFINE HCL 250 MG PO TABS: 250.0000 mg | ORAL_TABLET | Freq: Every day | ORAL | 0 refills | Status: DC

## 2017-12-05 NOTE — Patient Instructions (Addendum)
   IF you received an x-ray today, you will receive an invoice from [facility]. Please contact [facility] at [phone] with questions or concerns regarding your invoice.   IF you received labwork today, you will receive an invoice from [facility]. Please contact [facility] at [phone] with questions or concerns regarding your invoice.   Our billing staff will not be able to assist you with questions regarding bills from these companies.  You will be contacted with the lab results as soon as they are available. The fastest way to get your results is to activate your My Chart account. Instructions are located on the last page of this paperwork. If you have not heard from us regarding the results in 2 weeks, please contact this office.     Fungal Nail Infection Fungal nail infection is a common fungal infection of the toenails or fingernails. This condition affects toenails more often than fingernails. More than one nail may be infected. The condition can be passed from person to person (is contagious). What are the causes? This condition is caused by a fungus. Several types of funguses can cause the infection. These funguses are common in moist and warm areas. If your hands or feet come into contact with the fungus, it may get into a crack in your fingernail or toenail and cause the infection. What increases the risk? The following factors may make you more likely to develop this condition:  Being male.  Having diabetes.  Being of older age.  Living with someone who has the fungus.  Walking barefoot in areas where the fungus thrives, such as showers or locker rooms.  Having poor circulation.  Wearing shoes and socks that cause your feet to sweat.  Having athlete's foot.  Having a nail injury or history of a recent nail surgery.  Having psoriasis.  Having a weak body defense system (immune system).  What are the signs or symptoms? Symptoms of this  condition include:  A pale spot on the nail.  Thickening of the nail.  A nail that becomes yellow or brown.  A brittle or ragged nail edge.  A crumbling nail.  A nail that has lifted away from the nail bed.  How is this diagnosed? This condition is diagnosed with a physical exam. Your health care provider may take a scraping or clipping from your nail to test for the fungus. How is this treated? Mild infections do not need treatment. If you have significant nail changes, treatment may include:  Oral antifungal medicines. You may need to take the medicine for several weeks or several months, and you may not see the results for a long time. These medicines can cause side effects. Ask your health care provider what problems to watch for.  Antifungal nail polish and nail cream. These may be used along with oral antifungal medicines.  Laser treatment of the nail.  Surgery to remove the nail. This may be needed for the most severe infections.  Treatment takes a long time, and the infection may come back. Follow these instructions at home: Medicines  Take or apply over-the-counter and prescription medicines only as told by your health care provider.  Ask your health care provider about using over-the-counter mentholated ointment on your nails. Lifestyle   Do not share personal items, such as towels or nail clippers.  Trim your nails often.  Wash and dry your hands and feet every day.  Wear absorbent socks, and change your socks frequently.  Wear shoes that   allow air to circulate, such as sandals or canvas tennis shoes. Throw out old shoes.  Wear rubber gloves if you are working with your hands in wet areas.  Do not walk barefoot in shower rooms or locker rooms.  Do not use a nail salon that does not use clean instruments.  Do not use artificial nails. General instructions  Keep all follow-up visits as told by your health care provider. This is important.  Use  antifungal foot powder on your feet and in your shoes. Contact a health care provider if: Your infection is not getting better or it is getting worse after several months. This information is not intended to replace advice given to you by your health care provider. Make sure you discuss any questions you have with your health care provider. Document Released: 09/10/2000 Document Revised: 02/19/2016 Document Reviewed: 03/17/2015 Elsevier Interactive Patient Education  2018 Elsevier Inc.  

## 2017-12-05 NOTE — Progress Notes (Signed)
Chief Complaint  Patient presents with  . Nail Problem    great toe on right foot, onset: approx. a month    HPI  Pt with recurrence of nail infection of the great toe In 2016 he was treated and it resolved with oral lamisil The patient states that he has been pressure washing and now the nail fungus has returned. He attributes it to getting his feet wet No rash of the legs, no callous or corns. He also is interested in a topical nail polish to help move things along.    Past Medical History:  Diagnosis Date  . BPH with elevated PSA     Current Outpatient Medications  Medication Sig Dispense Refill  . tamsulosin (FLOMAX) 0.4 MG CAPS capsule Take 0.4 mg by mouth. 3 times a week    . ciclopirox (PENLAC) 8 % solution Apply topically at bedtime. Apply over nail and surrounding skin. Apply daily over previous coat. After seven (7) days, may remove with alcohol and continue cycle. 6.6 mL 3  . terbinafine (LAMISIL) 250 MG tablet Take 1 tablet (250 mg total) by mouth daily. 90 tablet 0   No current facility-administered medications for this visit.     Allergies: No Known Allergies  Past Surgical History:  Procedure Laterality Date  . HERNIA REPAIR      Social History   Socioeconomic History  . Marital status: Widowed    Spouse name: n/a  . Number of children: 2  . Years of education: 6115  . Highest education level: None  Social Needs  . Financial resource strain: None  . Food insecurity - worry: None  . Food insecurity - inability: None  . Transportation needs - medical: None  . Transportation needs - non-medical: None  Occupational History  . 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 Smoking status: Never Smoker  . Smokeless tobacco: Never Used  Substance and Sexual Activity  . Alcohol use: No  . Drug use: No  . Sexual activity: Yes    Partners: Female    Birth control/protection: Condom    Comment: "not that much"  Other Topics  Concern  . None  Social History Narrative   Lives alone. Widowed. Wife died in [id number] of metastatic breast cancer at age 74 years. Education: college. Pt. Does exercise[place] One child in [place].    Family History  Problem Relation Age of Onset  . Kidney disease Mother   . COPD Father   . Cancer Brother [id number]       colon cancer     ROS Review of Systems See HPI Constitution: No fevers or chills No malaise No diaphoresis Skin: No rash or itching Eyes: no blurry vision, no double vision GU: no dysuria or hematuria Neuro: no dizziness or headaches all others reviewed and negative   Objective: Vitals:   12/05/17 1108  BP: (!) 146/77  Pulse: 79  Resp: 16  Temp: 98.1 F (36.7 C)  TempSrc: Oral  SpO2: 96%  Weight: 173 lb 3.2 oz (78.6 kg)  Height: 5\' 7"  (1.702 m)    Physical Exam  Constitutional: He is oriented to person, place, and time. He appears well-developed and well-nourished.  HENT:  Head: Normocephalic and atraumatic.  Pulmonary/Chest: Effort normal.  Neurological: He is alert and oriented to person, place, and time.  Skin: Skin is warm. Capillary refill takes less than 2 seconds.  Nail thickened with gray-yellow color  Psychiatric:  He has a normal mood and affect. His behavior is normal. Judgment and thought content normal.    Assessment and Plan Kalai was seen today for nail problem.  Diagnoses and all orders for this visit:  Toenail fungus -     Hepatic Function Panel -     Hepatic Function Panel; Future  Encounter for medication monitoring -     Hepatic Function Panel -     Hepatic Function Panel; Future  Other orders -     Cancel: Flu Vaccine QUAD 36+ mos IM -     terbinafine (LAMISIL) 250 MG tablet; Take 1 tablet (250 mg total) by mouth daily. -     ciclopirox (PENLAC) 8 % solution; Apply topically at bedtime. Apply over nail and surrounding skin. Apply daily over previous coat. After seven (7) days, may remove with alcohol  and continue cycle.   Patient advised to return for hepatic function test in 3 months after starting medication Sent in topical and oral treatment Gave home care guide   Lavonna Lampron A Creta Levin

## 2017-12-06 LAB — HEPATIC FUNCTION PANEL
ALBUMIN: 3.9 g/dL (ref 3.5–4.8)
ALK PHOS: 55 IU/L (ref 39–117)
ALT: 18 IU/L (ref 0–44)
AST: 16 IU/L (ref 0–40)
BILIRUBIN TOTAL: 0.6 mg/dL (ref 0.0–1.2)
Bilirubin, Direct: 0.14 mg/dL (ref 0.00–0.40)
TOTAL PROTEIN: 5.9 g/dL — AB (ref 6.0–8.5)

## 2018-05-06 ENCOUNTER — Ambulatory Visit: Payer: 59 | Admitting: Physician Assistant

## 2018-05-06 ENCOUNTER — Encounter: Payer: Self-pay | Admitting: Physician Assistant

## 2018-05-06 ENCOUNTER — Other Ambulatory Visit: Payer: Self-pay

## 2018-05-06 VITALS — BP 135/69 | HR 64 | Temp 97.5°F | Resp 18 | Ht 66.93 in | Wt 178.6 lb

## 2018-05-06 DIAGNOSIS — S76302A Unspecified injury of muscle, fascia and tendon of the posterior muscle group at thigh level, left thigh, initial encounter: Secondary | ICD-10-CM

## 2018-05-06 MED ORDER — NAPROXEN 500 MG PO TABS: 500.0000 mg | ORAL_TABLET | Freq: Two times a day (BID) | ORAL | 0 refills | Status: DC

## 2018-05-06 NOTE — Progress Notes (Signed)
Andrew Hurst  MRN: [id number] DOB: 1945-12-13  PCP: Patient, No Pcp Per  Subjective:  Pt is a pleasant 72 year old male who presents to clinic for left thigh injury.  Last night he was walking down his steps and when he got to the bottom of the stairs he felt sharp pain on the back of his left thigh.  Felt like his left thigh "wanted to give away".  Feels swollen in the back of his leg.  Pain is worse with bending his knee.  Pain is about 8 out of 10.  Feels better when he is not moving.  He applied a heating pad last night which helped.  He has not taken anything for pain.  No history of injury of this kind.   Review of Systems  Respiratory: Negative for cough and shortness of breath.   Musculoskeletal: Positive for gait problem and myalgias. Negative for arthralgias and joint swelling.  Skin: Negative.     Patient Active Problem List   Diagnosis Date Noted  . History of colonic polyps 09/23/2017  . BPH (benign prostatic hypertrophy) 07/17/2014  . Hearing loss of left ear 07/17/2014  . Hyperglycemia 07/17/2014  . Elevated PSA, less than 10 ng/ml 10/29/2013    Current Outpatient Medications on File Prior to Visit  Medication Sig Dispense Refill  . tamsulosin (FLOMAX) 0.4 MG CAPS capsule Take 0.4 mg by mouth. 3 times a week    . ciclopirox (PENLAC) 8 % solution Apply topically at bedtime. Apply over nail and surrounding skin. Apply daily over previous coat. After seven (7) days, may remove with alcohol and continue cycle. (Patient not taking: Reported on 05/06/2018) 6.6 mL 3  . terbinafine (LAMISIL) 250 MG tablet Take 1 tablet (250 mg total) by mouth daily. (Patient not taking: Reported on 05/06/2018) 90 tablet 0   No current facility-administered medications on file prior to visit.     No Known Allergies   Objective:  BP 135/69 (BP Location: Right Arm, Patient Position: Sitting, Cuff Size: Normal)   Pulse 64   Temp (!) 97.5 F (36.4 C) (Oral)   Resp 18   Ht 5' 6.93"  (1.7 m)   Wt 178 lb 9.6 oz (81 kg)   SpO2 95%   BMI 28.03 kg/m   Physical Exam  Constitutional: He is oriented to person, place, and time. He appears well-developed and well-nourished.  Musculoskeletal:       Right knee: He exhibits no swelling, no effusion and no bony tenderness. No tenderness found.       Left knee: He exhibits no swelling, no effusion and no bony tenderness. No tenderness found.       Left upper leg: He exhibits tenderness. He exhibits no bony tenderness, no swelling and no deformity.       Legs: Ambulating on one crutch.  TTP L hamstring from mid thigh to posterior knee.  No bulging, swelling, warmth, edema.  Pain with flexion of left knee.  Pain with flexion and extension of knee against resistance.  Neurological: He is alert and oriented to person, place, and time.  Skin: Skin is warm and dry.  Psychiatric: He has a normal mood and affect. His behavior is normal. Judgment and thought content normal.  Vitals reviewed.   Assessment and Plan :  1. Left hamstring injury, initial encounter -Patient presents with posterior left thigh pain after injury walking downstairs.  Suspect hamstring strain.  Ace wrap applied.  Advised price principles.  Plan to  refer to sports medicine for evaluation and treatment plan. - Ambulatory referral to Sports Medicine - naproxen (NAPROSYN) 500 MG tablet; Take 1 tablet (500 mg total) by mouth 2 (two) times daily with a meal.  Dispense: 30 tablet; Refill: 0   Whitney Nikos Anglemyer, PA-C  [facility] 05/06/2018 8:41 AM  Please note: Portions of this report may have been transcribed using dragon voice recognition software. Every effort was made to ensure accuracy; however, inadvertent computerized transcription errors may be present.

## 2018-05-06 NOTE — Patient Instructions (Addendum)
For your hamstring: Protection, rest, ice, compression, and elevation (PRICE) therapy is traditionally used to limit the extent of localized bleeding, swelling, and pain from acute muscle strains   You will receive a phone call to schedule an appointment with sports medicine.   Ace wrap for protection and compression. Wear this daily for the next week or two.   Naprosyn is an NSAID. Do not use with any other otc pain medication other than tylenol/acetaminophen - so no aleve, ibuprofen, motrin, advil, etc. You can take this with tylenol if needed for increased pain reduction.   Flexeril is a muscle relaxer. Take this as directed. This may make you drowsy. Please take precaution.    Hamstring Strain A hamstring strain is an injury that occurs when the hamstring muscles are overstretched or overloaded. The hamstring muscles are a group of muscles at the back of the thighs. These muscles are used in straightening the hips, bending the knees, and pulling back the legs. This type of injury is often called a pulled hamstring muscle. The severity of a muscle strain is rated in degrees. First-degree strains have the least amount of muscle fiber tearing and pain. Second-degree and third-degree strains have increasingly more tearing and pain. What are the causes? Hamstring strains occur when a sudden, violent force is placed on these muscles and stretches them too far. This often occurs during activities that involve running, jumping, kicking, or weight lifting. What increases the risk? Hamstring strains are especially common in athletes. Other things that can increase your risk for this injury include:  Having low strength, endurance, or flexibility of the hamstring muscles.  Performing high-impact physical activity.  Having poor physical fitness.  Having a previous leg injury.  Having fatigued muscles.  Older age.  What are the signs or symptoms?  Pain in the back of the  thigh.  Bruising.  Swelling.  Muscle spasm.  Difficulty using the muscle because of pain or lack of normal function. For severe strains, you may have a popping or snapping feeling when the injury occurs. How is this diagnosed? Your health care provider will perform a physical exam and ask about your medical history. How is this treated? Often, the best treatment for a hamstring strain is protecting, resting, icing, applying compression, and elevating the injured area. This is referred to as the PRICE method of treatment. Your health care provider may also recommend medicines to help reduce pain or inflammation. Follow these instructions at home:  Use the PRICE method of treatment to promote muscle healing during the first 2-3 days after your injury. The PRICE method involves: ? P-Protecting the muscle from being injured again. ? R-Restricting your activity and resting the injured body part. ? I-Icing your injury. To do this, put ice in a plastic bag. Place a towel between your skin and the bag. Then, apply the ice and leave it on for 20 minutes, 2-3 times per day. After the third day, switch to moist heat packs. ? C-Applying compression to the injured area with an elastic bandage. Be careful not to wrap it too tightly. That may interfere with blood circulation or may increase swelling. ? E-Elevating the injured body part above the level of your heart as often as you can. You can do this by putting a pillow under your thigh when you sit or lie down.  Take medicines only as directed by your health care provider.  Begin exercising or stretching as directed by your health care provider.  Do not  return to full activity level until your health care provider approves.  Keep all follow-up visits as directed by your health care provider. This is important. Contact a health care provider if:  You have increasing pain or swelling in the injured area.  You have numbness, tingling, or a  significant loss of strength in the injured area.  Your foot or your toes become cold or turn blue. This information is not intended to replace advice given to you by your health care provider. Make sure you discuss any questions you have with your health care provider. Document Released: 06/08/2001 Document Revised: 02/19/2016 Document Reviewed: 04/29/2014 Elsevier Interactive Patient Education  2018 [employer].   IF you received an x-ray today, you will receive an invoice from [facility]. Please contact [facility] at [phone] with questions or concerns regarding your invoice.   IF you received labwork today, you will receive an invoice from [facility]. Please contact [facility] at [phone] with questions or concerns regarding your invoice.   Our billing staff will not be able to assist you with questions regarding bills from these companies.  You will be contacted with the lab results as soon as they are available. The fastest way to get your results is to activate your My Chart account. Instructions are located on the last page of this paperwork. If you have not heard from [place] regarding the results in 2 weeks, please contact this office.

## 2018-05-09 ENCOUNTER — Encounter: Payer: Self-pay | Admitting: Physician Assistant

## 2018-05-09 ENCOUNTER — Ambulatory Visit: Payer: BLUE CROSS/BLUE SHIELD | Admitting: Physician Assistant

## 2018-05-09 ENCOUNTER — Other Ambulatory Visit: Payer: Self-pay

## 2018-05-09 VITALS — BP 151/70 | HR 63 | Temp 98.4°F | Resp 16 | Ht 66.93 in | Wt 175.8 lb

## 2018-05-09 DIAGNOSIS — S76302D Unspecified injury of muscle, fascia and tendon of the posterior muscle group at thigh level, left thigh, subsequent encounter: Secondary | ICD-10-CM

## 2018-05-09 NOTE — Progress Notes (Signed)
   Andrew Hurst  MRN: [id number] DOB: 10/20/1945  PCP: Patient, No Pcp Per  Subjective:  Pt is a 72 year old male who presents to clinic for f/u left hamstring injury.  He was here 8/10 for this c/c and advised PRICE principles with naprosyn and sports medicine referral. Today he states he is feeling better.  He continues to use the Ace wrap and ice.  This is helping.  He has taken 1 dose of naproxen.  He endorses bruising that has developed since his last visit here.  Review of Systems  Musculoskeletal: Positive for myalgias. Negative for arthralgias and joint swelling.  Skin: Positive for color change (bruise posterior left thigh).    Patient Active Problem List   Diagnosis Date Noted  . History of colonic polyps 09/23/2017  . BPH (benign prostatic hypertrophy) 07/17/2014  . Hearing loss of left ear 07/17/2014  . Hyperglycemia 07/17/2014  . Elevated PSA, less than 10 ng/ml 10/29/2013    Current Outpatient Medications on File Prior to Visit  Medication Sig Dispense Refill  . naproxen (NAPROSYN) 500 MG tablet Take 1 tablet (500 mg total) by mouth 2 (two) times daily with a meal. 30 tablet 0  . tamsulosin (FLOMAX) 0.4 MG CAPS capsule Take 0.4 mg by mouth. 3 times a week    . ciclopirox (PENLAC) 8 % solution Apply topically at bedtime. Apply over nail and surrounding skin. Apply daily over previous coat. After seven (7) days, may remove with alcohol and continue cycle. (Patient not taking: Reported on 05/06/2018) 6.6 mL 3  . terbinafine (LAMISIL) 250 MG tablet Take 1 tablet (250 mg total) by mouth daily. (Patient not taking: Reported on 05/06/2018) 90 tablet 0   No current facility-administered medications on file prior to visit.     No Known Allergies   Objective:  BP (!) 151/70   Pulse 63   Temp 98.4 F (36.9 C)   Resp 16   Ht 5' 6.93" (1.7 m)   Wt 175 lb 12.8 oz (79.7 kg)   SpO2 96%   BMI 27.59 kg/m   Physical Exam  Constitutional: He is oriented to person, place,  and time. He appears well-developed and well-nourished.  Cardiovascular: Normal rate and regular rhythm.  Musculoskeletal:       Left upper leg: He exhibits tenderness. He exhibits no bony tenderness and no swelling.  Neurological: He is alert and oriented to person, place, and time.  Skin: Skin is warm and dry.  Psychiatric: He has a normal mood and affect. His behavior is normal. Judgment and thought content normal.  Vitals reviewed.   Assessment and Plan :  1. Left hamstring injury, subsequent encounter -Patient presents for follow-up left hamstring injury.  He is improving with Price principle and naproxen.  Continue therapy as discussed.  Work note written to return to work asking for 10-minute breaks every 3 hours as needed for the next week.  Return to clinic as needed   Marco CollieWhitney Demarlo Riojas, PA-C  [facility] 05/09/2018 9:23 AM  Please note: Portions of this report may have been transcribed using dragon voice recognition software. Every effort was made to ensure accuracy; however, inadvertent computerized transcription errors may be present.

## 2018-05-09 NOTE — Patient Instructions (Addendum)
  On your work days take Naproxen twice daily.  Continue wrapping your thigh with an ACE wrap (or thigh sleeve) for the next 1-2 weeks.  Continue icing your thigh for the next few days as needed.    IF you received an x-ray today, you will receive an invoice from [facility]. Please contact [facility] at [phone] with questions or concerns regarding your invoice.   IF you received labwork today, you will receive an invoice from [facility]. Please contact [facility] at [phone] with questions or concerns regarding your invoice.   Our billing staff will not be able to assist you with questions regarding bills from these companies.  You will be contacted with the lab results as soon as they are available. The fastest way to get your results is to activate your My Chart account. Instructions are located on the last page of this paperwork. If you have not heard from us regarding the results in 2 weeks, please contact this office.

## 2018-05-15 ENCOUNTER — Encounter: Payer: Self-pay | Admitting: General Practice

## 2019-03-27 ENCOUNTER — Telehealth: Payer: Self-pay

## 2019-03-27 ENCOUNTER — Encounter: Payer: Self-pay | Admitting: Family Medicine

## 2019-03-27 NOTE — Telephone Encounter (Signed)
Called patient to do their pre-visit COVID screening.  Call went to voicemail. Unable to do prescreening.  

## 2019-03-28 ENCOUNTER — Ambulatory Visit (INDEPENDENT_AMBULATORY_CARE_PROVIDER_SITE_OTHER): Payer: BC Managed Care – PPO | Admitting: Family Medicine

## 2019-03-28 ENCOUNTER — Encounter: Payer: Self-pay | Admitting: Family Medicine

## 2019-03-28 ENCOUNTER — Other Ambulatory Visit: Payer: Self-pay

## 2019-03-28 VITALS — BP 147/73 | HR 61 | Temp 97.3°F | Resp 17 | Ht 65.5 in | Wt 173.4 lb

## 2019-03-28 DIAGNOSIS — Z131 Encounter for screening for diabetes mellitus: Secondary | ICD-10-CM | POA: Diagnosis not present

## 2019-03-28 DIAGNOSIS — N400 Enlarged prostate without lower urinary tract symptoms: Secondary | ICD-10-CM

## 2019-03-28 DIAGNOSIS — R972 Elevated prostate specific antigen [PSA]: Secondary | ICD-10-CM | POA: Diagnosis not present

## 2019-03-28 DIAGNOSIS — Z130 Encounter for screening for diseases of the blood and blood-forming organs and certain disorders involving the immune mechanism: Secondary | ICD-10-CM | POA: Diagnosis not present

## 2019-03-28 DIAGNOSIS — Z1329 Encounter for screening for other suspected endocrine disorder: Secondary | ICD-10-CM

## 2019-03-28 DIAGNOSIS — Z0001 Encounter for general adult medical examination with abnormal findings: Secondary | ICD-10-CM | POA: Diagnosis not present

## 2019-03-28 DIAGNOSIS — Z7689 Persons encountering health services in other specified circumstances: Secondary | ICD-10-CM

## 2019-03-28 DIAGNOSIS — R030 Elevated blood-pressure reading, without diagnosis of hypertension: Secondary | ICD-10-CM

## 2019-03-28 DIAGNOSIS — Z1389 Encounter for screening for other disorder: Secondary | ICD-10-CM

## 2019-03-28 DIAGNOSIS — R011 Cardiac murmur, unspecified: Secondary | ICD-10-CM | POA: Diagnosis not present

## 2019-03-28 NOTE — Progress Notes (Signed)
Patient ID: Kerry Kassllen Ronald Allred, male    DOB: November 12, 1945, 73 y.o.   MRN: [id number]  PCP: Bing NeighborsHarris, Deral Schellenberg S, FNP  Chief Complaint  Patient presents with  . Establish Care  . Annual Exam    Subjective:  HPI Kerry Kassllen Ronald Zou is a 73 y.o. male, nonsmoker presents to establish care and for a complete physical exam.  Chronic conditions include: Kerry Kassllen Ronald Delrosario has Benign prostatic hyperplasia; Hearing loss of left ear; Hyperglycemia; Elevated PSA, less than 10 ng/ml; and History of colonic polyps on their problem list.  BPH/Elevated BP Patient suffers from BPH and has not had a recent PSA in over 2 years.  He [facility],  however has not been seen by the urologist in over 2 years.  He is currently managing BPH with tamsulosin.  Ron has also had elevated blood pressure readings dating back at least 1 year.  He reports checking his blood pressure at home and obtaining readings in the 130's-140's/<80  however he does not feel this blood pressure is concerning for hypertension. He denies chest pain, shortness of breath, weakness, or palpitations. Has had elevated blood pressure readings in the past    Health Promotion:  Routine physical activity: exercises regularly   Current BMI: Body mass index is 28.42 kg/m.  Dietary habits: low sodium and low fat diet.  Health Screening Current/Overdue:   Immunizations: Declined pneumococcal 13 PSA-due today Last Dental Exam: current  Last Eye Exam: current no eye exam   Current home medications include: Prior to Admission medications   Medication Sig Start Date End Date Taking? Authorizing Provider  tamsulosin (FLOMAX) 0.4 MG CAPS capsule Take 0.4 mg by mouth. 3 times a week   Yes [provider]    Family History  Problem Relation Age of Onset  . Kidney disease Mother   . COPD Father   . Cancer Brother 7867       colon cancer    No Known Allergies  Social History   Socioeconomic History  . Marital  status: Widowed    Spouse name: n/a  . Number of children: 2  . Years of education: 6415  . Highest education level: Not on file  Occupational History  . 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 Financial resource strain: Not on file  . Food insecurity    Worry: Not on file    Inability: Not on file  . Transportation needs    Medical: Not on file    Non-medical: Not on file  Tobacco Use  . Smoking status: Never Smoker  . Smokeless tobacco: Never Used  Substance and Sexual Activity  . Alcohol use: No  . Drug use: No  . Sexual activity: Yes    Partners: Female    Birth control/protection: Condom    Comment: "not that much"  Lifestyle  . Physical activity    Days per week: Not on file    Minutes per session: Not on file  . Stress: Not on file  Relationships  . Social Musicianconnections    Talks on phone: Not on file    Gets together: Not on file    Attends religious service: Not on file    Active member of club or organization: Not on file    Attends meetings of clubs or organizations: Not on file    Relationship status: Not on file  . Intimate partner violence    Fear of current  or ex partner: Not on file    Emotionally abused: Not on file    Physically abused: Not on file    Forced sexual activity: Not on file  Other Topics Concern  . Not on file  Social History Narrative   Lives alone. Widowed. Wife died in 49 of metastatic breast cancer at age 64 years. Education: college. Pt. Does exercise. One child in [place], [place]. One child in [place].    Review of Systems Pertinent negatives listed in HPI Past Medical, Surgical Family and Social History reviewed and updated.  Objective:   Today's Vitals   03/28/19 1422  BP: (!) 147/73  Pulse: 61  Resp: 17  Temp: (!) 97.3 F (36.3 C)  TempSrc: Temporal  SpO2: 95%  Weight: 173 lb 6.4 oz (78.7 kg)  Height: 5' 5.5" (1.664 m)    Wt Readings from Last 3 Encounters:  03/28/19 173 lb 6.4 oz  (78.7 kg)  05/09/18 175 lb 12.8 oz (79.7 kg)  05/06/18 178 lb 9.6 oz (81 kg)    Physical Exam Constitutional:      Appearance: Normal appearance.  HENT:     Head: Normocephalic.     Nose: Nose normal.  Neck:     Musculoskeletal: Normal range of motion. No neck rigidity.  Cardiovascular:     Rate and Rhythm: Normal rate and regular rhythm.     Pulses: Normal pulses.     Heart sounds: Murmur present.  Pulmonary:     Effort: Pulmonary effort is normal.     Breath sounds: Normal breath sounds.  Skin:    General: Skin is warm and dry.  Neurological:     General: No focal deficit present.     Mental Status: He is alert.        Assessment & Plan:  1. Encounter to establish care  2. Screening for blood or protein in urine -unable to provide specimen 3. BPH with elevated PSA - PSA, followed by urology at [place] tamsulosin  4. Screening for diabetes mellitus - Comprehensive metabolic panel - Hemoglobin A1c  5. Screening for deficiency anemia - CBC with Differential  6. Thyroid disorder screen - Thyroid Panel With TSH  7. Murmur, cardiac Audible murmur 3-4 ICS. No history of heart disease, however, has had documented elevations in blood pressure dating back over 1 year. Otherwise asymptomatic of concerning symptoms.   EKG 12-Lead-Sinus Bradycardia bpm 59, no ST changes  - Ambulatory referral to Cardiology  8. Elevated BP without diagnosis of hypertension Patient is extremely hesitant to start antihypertension therapy.  In review of EMR patient has had at least a 1 year history of elevated blood pressure greater than 130/90.  Per his report at home readings he is consistently in the 629B to 284X systolic and less than 90 diastolic.  Discussed with patient to continue monitoring his blood pressure at home for over the next 4 weeks daily and keep a record of his readings come back to the office in 4 weeks, bring his meter and his log of blood pressure readings and at  that we can discuss again option for blood pressure management.     RTC: 4 weeks BP follow-up  Molli Barrows, FNP Primary Care at [facility] [address] [place] [phone]: [phone]

## 2019-03-28 NOTE — Patient Instructions (Addendum)
Heart Murmur A heart murmur is an extra sound that is caused by chaotic blood flow through the valves of the heart. The murmur can be heard as a "hum" or "whoosh" sound when blood flows through the heart. There are two types of heart murmurs:  Innocent (benign) murmurs. Most people with this type of heart murmur do not have a heart problem. Many children have innocent heart murmurs. Your health care provider may suggest some basic tests to find out whether your murmur is an innocent murmur. If an innocent heart murmur is found, there is no need for further tests or treatment and no need to restrict activities or stop playing sports.  Abnormal murmurs. These types of murmurs can occur in children and adults. Abnormal murmurs may be a sign of a more serious heart condition, such as a heart defect present at birth (congenital defect) or heart valve disease. What are the causes?  The heart has four areas called chambers. Valves separate the upper and lower chambers from each other (tricuspid valve and mitral valve) and separate the lower chambers of the heart from pathways that lead away from the heart (aortic valve and pulmonary valve). Normally, the valves open to let blood flow through or out of your heart, and then they shut to keep the blood from flowing backward. This condition is caused by heart valves that are not working properly.  In children, abnormal heart murmurs are typically caused by congenital defects.  In adults, abnormal murmurs are usually caused by heart valve problems from disease, infection, or aging. This condition may also be caused by:  Pregnancy.  Fever.  Overactive thyroid gland.  Anemia.  Exercise.  Rapid growth spurts (in children). What are the signs or symptoms? Innocent murmurs do not cause symptoms, and many people with abnormal murmurs may not have symptoms. If symptoms do develop, they may include:  Shortness of breath.  Blue coloring of the skin,  especially on the fingertips.  Chest pain.  Palpitations, or feeling a fluttering or skipped heartbeat.  Fainting.  Persistent cough.  Getting tired much faster than expected.  Swelling in the abdomen, feet, or ankles. How is this diagnosed? This condition may be diagnosed during a routine physical or other exam. If your health care provider hears a murmur with a stethoscope, he or she will listen for:  Where the murmur is located in your heart.  How long the murmur lasts (duration).  When the murmur is heard during the heartbeat.  How loud the murmur is. This may help the health care provider figure out what is causing the murmur. You may be referred to a heart specialist (cardiologist). You may also have other tests, including:  Electrocardiogram (ECG or EKG). This test measures the electrical activity of your heart.  Echocardiogram. This test uses high frequency sound waves to make pictures of your heart.  MRI or chest X-ray.  Cardiac catheterization. This test looks at blood flow through the arteries around the heart. For children and adults who have an abnormal heart murmur and want to stay active, it is important to:  Complete testing.  Review test results.  Receive recommendations from your health care provider. If heart disease is present, it may not be safe to play or be active. How is this treated? Heart murmurs themselves do not need treatment. In some cases, a heart murmur may go away on its own. If an underlying problem or disease is causing the murmur, you may need treatment. If treatment  is needed, it will depend on the type and severity of the disease or heart problem causing the murmur. Treatment may include:  Medicine.  Surgery.  Dietary and lifestyle changes. Follow these instructions at home:  Talk with your health care provider before participating in sports or other activities that require a lot of effort and energy (are strenuous).  Learn as  much as possible about your condition and any related diseases. Ask your health care provider if you may be at risk for any medical emergencies.  Talk with your health care provider about what symptoms you should look out for.  It is up to you to get your test results. Ask your health care provider, or the department that is doing the test, when your results will be ready.  Keep all follow-up visits as told by your health care provider. This is important. Contact a health care provider if:  You are frequently short of breath.  You feel more tired than usual.  You are having a hard time keeping up with normal activities or fitness routines.  You have swelling in your ankles or feet.  You notice that your heart often beats irregularly.  You develop any new symptoms. Get help right away if:  You have chest pain.  You are having trouble breathing.  You feel light-headed or you pass out.  Your symptoms suddenly get worse. These symptoms may represent a serious problem that is an emergency. Do not wait to see if the symptoms will go away. Get medical help right away. Call your local emergency services (911 in the [place] Do not drive yourself to the hospital. Summary  Normally, the heart valves open to let blood flow through or out of your heart, and then they shut to keep the blood from flowing backward.  A heart murmur is caused by heart valves that are not working properly.  You may need treatment if an underlying problem or disease is causing the heart murmur. Treatment may include medicine, surgery, or dietary and lifestyle changes.  Talk with your health care provider before participating in sports or other activities that require a lot of effort and energy (are strenuous).  Talk with your health care provider about what symptoms you should watch out for. This information is not intended to replace advice given to you by your health care provider. Make sure you discuss any  questions you have with your health care provider. Document Released: 10/21/2004 Document Revised: 03/07/2018 Document Reviewed: 03/07/2018 Elsevier Patient Education  2020 [place] you for choosing Primary Care at [facility] to be your medical home!    Andrew Hurst was seen by Molli Barrows, FNP today.   Andrew Hurst's primary care provider is Scot Jun, FNP.   For the best care possible, you should try to see Molli Barrows, FNP-C whenever you come to the clinic.   We look forward to seeing you again soon!  If you have any questions about your visit today, please call us at [phone] or feel free to reach your primary care provider via [facility].    Keeping you healthy  Get these tests  Blood pressure- Have your blood pressure checked once a year by your healthcare provider.  Normal blood pressure is 120/80  Weight- Have your body mass index (BMI) calculated to screen for obesity.  BMI is a measure of body fat based on height and weight. You can also calculate your own BMI  at [email or web address].nhlbisuport.[email or web address]/[email or web address]/.  Cholesterol- Have your cholesterol checked every year.  Diabetes- Have your blood sugar checked regularly if you have high blood pressure, high cholesterol, have a family history of diabetes or if you are overweight.  Screening for Colon Cancer- Colonoscopy starting at age 75.  Screening may begin sooner depending on your family history and other health conditions. Follow up colonoscopy as directed by your Gastroenterologist.  Screening for Prostate Cancer- Both blood work (PSA) and a rectal exam help screen for Prostate Cancer.  Screening begins at age 75 with African-American men and at age 75 with Caucasian men.  Screening may begin sooner depending on your family history.  Take these medicines  Aspirin- One aspirin daily can help prevent Heart disease and Stroke.  Flu shot- Every fall.  Tetanus- Every 10 years.  Zostavax- Once  after the age of 75 to prevent Shingles.  Pneumonia shot- Once after the age of 75; if you are younger than 5865, ask your healthcare provider if you need a Pneumonia shot.  Take these steps  Don't smoke- If you do smoke, talk to your doctor about quitting.  For tips on how to quit, go to www.smokefree.gov or call 1-800-QUIT-NOW.  Be physically active- Exercise 5 days a week for at least 30 minutes.  If you are not already physically active start slow and gradually work up to 30 minutes of moderate physical activity.  Examples of moderate activity include walking briskly, mowing the yard, dancing, swimming, bicycling, etc.  Eat a healthy diet- Eat a variety of healthy food such as fruits, vegetables, low fat milk, low fat cheese, yogurt, lean meant, poultry, fish, beans, tofu, etc. For more information go to www.thenutritionsource.org  Drink alcohol in moderation- Limit alcohol intake to less than two drinks a day. Never drink and drive.  Dentist- Brush and floss twice daily; visit your dentist twice a year.  Depression- Your emotional health is as important as your physical health. If you're feeling down, or losing interest in things you would normally enjoy please talk to your healthcare provider.  Eye exam- Visit your eye doctor every year.  Safe sex- If you may be exposed to a sexually transmitted infection, use a condom.  Seat belts- Seat belts can save your life; always wear one.  Smoke/Carbon Monoxide detectors- These detectors need to be installed on the appropriate level of your home.  Replace batteries at least once a year.  Skin cancer- When out in the sun, cover up and use sunscreen 15 SPF or higher.  Violence- If anyone is threatening you, please tell your healthcare provider.  Living Will/ Health care power of attorney- Speak with your healthcare provider and family.

## 2019-03-29 LAB — COMPREHENSIVE METABOLIC PANEL
ALT: 16 IU/L (ref 0–44)
AST: 18 IU/L (ref 0–40)
Albumin/Globulin Ratio: 2.5 — ABNORMAL HIGH (ref 1.2–2.2)
Albumin: 4 g/dL (ref 3.7–4.7)
Alkaline Phosphatase: 58 IU/L (ref 39–117)
BUN/Creatinine Ratio: 20 (ref 10–24)
BUN: 29 mg/dL — ABNORMAL HIGH (ref 8–27)
Bilirubin Total: 0.4 mg/dL (ref 0.0–1.2)
CO2: 25 mmol/L (ref 20–29)
Calcium: 8.7 mg/dL (ref 8.6–10.2)
Chloride: 106 mmol/L (ref 96–106)
Creatinine, Ser: 1.43 mg/dL — ABNORMAL HIGH (ref 0.76–1.27)
GFR calc Af Amer: 55 mL/min/{1.73_m2} — ABNORMAL LOW (ref >59)
GFR calc non Af Amer: 48 mL/min/{1.73_m2} — ABNORMAL LOW (ref >59)
Globulin, Total: 1.6 g/dL (ref 1.5–4.5)
Glucose: 101 mg/dL — ABNORMAL HIGH (ref 65–99)
Potassium: 4.3 mmol/L (ref 3.5–5.2)
Sodium: 144 mmol/L (ref 134–144)
Total Protein: 5.6 g/dL — ABNORMAL LOW (ref 6.0–8.5)

## 2019-03-29 LAB — CBC WITH DIFFERENTIAL/PLATELET
Basophils Absolute: 0.1 10*3/uL (ref 0.0–0.2)
Basos: 1 %
EOS (ABSOLUTE): 0.4 10*3/uL (ref 0.0–0.4)
Eos: 6 %
Hematocrit: 38.9 % (ref 37.5–51.0)
Hemoglobin: 13 g/dL (ref 13.0–17.7)
Immature Grans (Abs): 0 10*3/uL (ref 0.0–0.1)
Immature Granulocytes: 0 %
Lymphocytes Absolute: 2.7 10*3/uL (ref 0.7–3.1)
Lymphs: 39 %
MCH: 32 pg (ref 26.6–33.0)
MCHC: 33.4 g/dL (ref 31.5–35.7)
MCV: 96 fL (ref 79–97)
Monocytes Absolute: 0.6 10*3/uL (ref 0.1–0.9)
Monocytes: 8 %
Neutrophils Absolute: 3.2 10*3/uL (ref 1.4–7.0)
Neutrophils: 46 %
Platelets: 228 10*3/uL (ref 150–450)
RBC: 4.06 x10E6/uL — ABNORMAL LOW (ref 4.14–5.80)
RDW: 12.2 % (ref 11.6–15.4)
WBC: 7 10*3/uL (ref 3.4–10.8)

## 2019-03-29 LAB — PSA: Prostate Specific Ag, Serum: 11.3 ng/mL — ABNORMAL HIGH (ref 0.0–4.0)

## 2019-03-29 LAB — HEMOGLOBIN A1C
Est. average glucose Bld gHb Est-mCnc: 117 mg/dL
Hgb A1c MFr Bld: 5.7 % — ABNORMAL HIGH (ref 4.8–5.6)

## 2019-03-29 LAB — THYROID PANEL WITH TSH
Free Thyroxine Index: 1.4 (ref 1.2–4.9)
T3 Uptake Ratio: 27 % (ref 24–39)
T4, Total: 5 ug/dL (ref 4.5–12.0)
TSH: 2.2 u[IU]/mL (ref 0.450–4.500)

## 2019-04-03 ENCOUNTER — Ambulatory Visit (INDEPENDENT_AMBULATORY_CARE_PROVIDER_SITE_OTHER): Payer: BC Managed Care – PPO | Admitting: Cardiology

## 2019-04-03 ENCOUNTER — Encounter: Payer: Self-pay | Admitting: Cardiology

## 2019-04-03 ENCOUNTER — Telehealth: Payer: Self-pay | Admitting: Family Medicine

## 2019-04-03 ENCOUNTER — Other Ambulatory Visit: Payer: Self-pay

## 2019-04-03 DIAGNOSIS — R7303 Prediabetes: Secondary | ICD-10-CM | POA: Diagnosis not present

## 2019-04-03 DIAGNOSIS — R0609 Other forms of dyspnea: Secondary | ICD-10-CM | POA: Diagnosis not present

## 2019-04-03 DIAGNOSIS — R0600 Dyspnea, unspecified: Secondary | ICD-10-CM

## 2019-04-03 DIAGNOSIS — R011 Cardiac murmur, unspecified: Secondary | ICD-10-CM

## 2019-04-03 NOTE — Progress Notes (Signed)
Cardiology Consultation:    Date:  04/03/2019   ID:  Andrew Hurst, DOB 03-Aug-1946, MRN [id number]  PCP:  Bing NeighborsHarris, Kimberly S, FNP  Cardiologist:  Gypsy Balsamobert Kaleya Douse, MD   Referring MD: Bing NeighborsHarris, Kimberly S, FNP   Chief Complaint  Patient presents with  . Heart Murmur  . Blood pressure elevated in office  I have heart murmur  History of Present Illness:    Andrew Hurst is a 73 y.o. male who is being seen today for the evaluation of heart murmur at the request of Bing NeighborsHarris, Kimberly S, FNP.  He is a 73 years old very active gentleman still works.  He was referred to us because of heart murmur.  He was told years ago he does have a heart murmur years ago he was evaluated by [facility] and he was told that nothing to be worried.  However he is concerned about it despite he would like to be checked.  Overall he is doing very well.  Denies having any chest pain, tightness, pressure, burning in the chest, no palpitations no dizziness no passing out no swelling of lower extremities.  He still very active he still works and have no difficulty doing it.  He does have a week vacation and he clean his house he painted and was very active fall this time with no difficulties. He does have borderline hemoglobin A1c. I do not see any fasting lipid profile recently. He never smoked Does not have family history of premature coronary artery disease. His twin brother died because of prostate cancer.  He is in a research study at [employer]  Past Medical History:  Diagnosis Date  . Benign non-nodular prostatic hyperplasia with lower urinary tract symptoms   . BPH with elevated PSA     Past Surgical History:  Procedure Laterality Date  . HERNIA REPAIR      Current Medications: Current Meds  Medication Sig  . tamsulosin (FLOMAX) 0.4 MG CAPS capsule Take 0.4 mg by mouth. 3 times a week     Allergies:   Patient has no known allergies.   Social History   Socioeconomic History  . Marital status: Widowed     Spouse name: n/a  . Number of children: 2  . Years of education: 5315  . Highest education level: Not on file  Occupational History  . 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 Financial resource strain: Not on file  . Food insecurity    Worry: Not on file    Inability: Not on file  . Transportation needs    Medical: Not on file    Non-medical: Not on file  Tobacco Use  . Smoking status: Never Smoker  . Smokeless tobacco: Never Used  Substance and Sexual Activity  . Alcohol use: No  . Drug use: No  . Sexual activity: Yes    Partners: Female    Birth control/protection: Condom    Comment: "not that much"  Lifestyle  . Physical activity    Days per week: Not on file    Minutes per session: Not on file  . Stress: Not on file  Relationships  . Social Musicianconnections    Talks on phone: Not on file    Gets together: Not on file    Attends religious service: Not on file    Active member of club or organization: Not on file    Attends meetings of clubs or organizations: Not on file  Relationship status: Not on file  Other Topics Concern  . Not on file  Social History Narrative   Lives alone. Widowed. Wife died in 44 of metastatic breast cancer at age 10 years. Education: college. Pt. Does exercise. One child in [place], [place]. One child in [place].     Family History: The patient's family history includes COPD in his father; Cancer (age of onset: 39) in his brother; Kidney disease in his mother. ROS:   Please see the history of present illness.    All 14 point review of systems negative except as described per history of present illness.  EKGs/Labs/Other Studies Reviewed:    The following studies were reviewed today: EKG done by primary care physician showed sinus bradycardia rate of 59, normal P interval normal QS complex duration morphology no ST-T segment changes   Recent Labs: 03/28/2019: ALT 16; BUN 29; Creatinine, Ser 1.43; Hemoglobin  13.0; Platelets 228; Potassium 4.3; Sodium 144; TSH 2.200  Recent Lipid Panel    Component Value Date/Time   CHOL 162 07/17/2014 0955   TRIG 83 07/17/2014 0955   HDL 41 07/17/2014 0955   CHOLHDL 4.0 07/17/2014 0955   VLDL 17 07/17/2014 0955   LDLCALC 104 (H) 07/17/2014 0955    Physical Exam:    VS:  BP 140/70   Pulse 66   Ht 5\' 7"  (1.702 m)   Wt 173 lb 6.4 oz (78.7 kg)   SpO2 98%   BMI 27.16 kg/m     Wt Readings from Last 3 Encounters:  04/03/19 173 lb 6.4 oz (78.7 kg)  03/28/19 173 lb 6.4 oz (78.7 kg)  05/09/18 175 lb 12.8 oz (79.7 kg)     GEN:  Well nourished, well developed in no acute distress HEENT: Normal NECK: No JVD; No carotid bruits LYMPHATICS: No lymphadenopathy CARDIAC: RRR, no murmurs, no rubs, no gallops RESPIRATORY:  Clear to auscultation without rales, wheezing or rhonchi  ABDOMEN: Soft, non-tender, non-distended MUSCULOSKELETAL:  No edema; No deformity  SKIN: Warm and dry NEUROLOGIC:  Alert and oriented x 3 PSYCHIATRIC:  Normal affect   ASSESSMENT:    1. Heart murmur   2. Dyspnea on exertion   3. Borderline diabetes    PLAN:    In order of problems listed above:  1. Heart murmur.  Burley also palpable on my physical examination echocardiogram will be done to clarify the nature of the problem but overall I do not anticipate to find something that is significant. 2. 2.  Dyspnea on exertion but overall very good exercise tolerance echocardiogram will be done to assess left ventricular ejection fraction 3. Borderline diabetes.  That being addressed by primary care physician his hemoglobin A1c is elevated.  Exercise and diet should be sufficient at this stage. 4. Essential hypertension.  Blood pressure is mildly elevated not to the point of therapy is warranted at this time.  However I would suggest to follow this carefully and then treat appropriately if needed.  I will see him back in my office in about 6 weeks.  Sooner if he got a problem.   Overall I think he is doing very well.  We did discuss the issue of healthy lifestyle with good diet but he is already doing it.  We did talk about exercises on a regular basis.   Medication Adjustments/Labs and Tests Ordered: Current medicines are reviewed at length with the patient today.  Concerns regarding medicines are outlined above.  No orders of the defined types were placed in  this encounter.  No orders of the defined types were placed in this encounter.   Signed, Georgeanna Leaobert J. Raymie Trani, MD, [facility]. 04/03/2019 9:28 AM    [facility]

## 2019-04-03 NOTE — Addendum Note (Signed)
Addended by: Particia Nearing B on: 04/03/2019 09:41 AM   Modules accepted: Orders

## 2019-04-03 NOTE — Patient Instructions (Signed)
Medication Instructions:  Your physician recommends that you continue on your current medications as directed. Please refer to the Current Medication list given to you today.  If you need a refill on your cardiac medications before your next appointment, please call your pharmacy.   Lab work: None If you have labs (blood work) drawn today and your tests are completely normal, you will receive your results only by: Marland Kitchen MyChart Message (if you have MyChart) OR . A paper copy in the mail If you have any lab test that is abnormal or we need to change your treatment, we will call you to review the results.  Testing/Procedures: Your physician has requested that you have an echocardiogram. Echocardiography is a painless test that uses sound waves to create images of your heart. It provides your doctor with information about the size and shape of your heart and how well your heart's chambers and valves are working. This procedure takes approximately one hour. There are no restrictions for this procedure.    Follow-Up: At [facility], you and your health needs are our priority.  As part of our continuing mission to provide you with exceptional heart care, we have created designated Provider Care Teams.  These Care Teams include your primary Cardiologist (physician) and Advanced Practice Providers (APPs -  Physician Assistants and Nurse Practitioners) who all work together to provide you with the care you need, when you need it. You will need a follow up appointment in 6 weeks.  Any Other Special Instructions Will Be Listed Below (If Applicable).

## 2019-04-03 NOTE — Telephone Encounter (Signed)
Notify patient that recent labs  PSA level is elevated at 11.3, he will need to follow-up with Dr. Hillis Range at [facility]. In review of chart he has not been seen there since 2018, if he needs a new referral, please let me know.  Renal function is also abnormal compared to baseline. This could be related to enlarged prostate, dehydration or renal function decline. Avoid over the counter medications with ibuprofen or naproxen as these medications can worsen renal function.  reflect borderline diabetes with an A1C 5.7. Presently, I recommend lifestyle changes such as engaging in routine physical activity with a goal of 150 minutes per week, increasing intake of vegetables, fruits, fiber, and selecting lean cuts of meat.  A copy of PSA level sent electronically to Dr Darcus Austin

## 2019-04-04 NOTE — Telephone Encounter (Addendum)
Attempted to call patient. No answer. Voicemail full.

## 2019-04-09 NOTE — Telephone Encounter (Signed)
Patients wife returned call to [facility]. She was provided with all results. She is aware that patients PSA is elevated at 11.3, needs to follow up with Dr. Hillis Range at [facility]rology[facility]cology. He has not been seen there since 2018, if new referral needed call [facility] to inform so that PCP can place the referral. PSA results have been sent to Dr. Darcus Austin electronically. Renal function is abnormal compared to baseline. Could be related to enlarged prostate, dehydration or renal function decline. Advised that patient avoid over the counter medications with Ibuprofen or naproxen as these can worsen renal function. A1c 5.7 prediabetic. Recommended by PCP to adapt lifestyle changes such as engaging in routine physical activity for at least 150 minutes per week, increase intake of vegetables, fruits, fiber and lean cuts of meat. Wife expressed full understanding and did not have any questions or concerns. Nat Christen, CMA

## 2019-04-09 NOTE — Telephone Encounter (Signed)
Called patient, voicemail full. Called person listed on DPR and left voicemail asking her or patient to call [facility]. Nat Christen, CMA

## 2019-04-26 DIAGNOSIS — R972 Elevated prostate specific antigen [PSA]: Secondary | ICD-10-CM | POA: Diagnosis not present

## 2019-04-26 DIAGNOSIS — R300 Dysuria: Secondary | ICD-10-CM | POA: Diagnosis not present

## 2019-05-09 ENCOUNTER — Ambulatory Visit: Payer: BC Managed Care – PPO | Admitting: Nurse Practitioner

## 2019-10-20 ENCOUNTER — Ambulatory Visit: Payer: BC Managed Care – PPO | Attending: Internal Medicine

## 2019-10-20 ENCOUNTER — Ambulatory Visit: Payer: BC Managed Care – PPO

## 2019-10-20 DIAGNOSIS — Z23 Encounter for immunization: Secondary | ICD-10-CM | POA: Insufficient documentation

## 2019-10-20 NOTE — Progress Notes (Signed)
   Covid-19 Vaccination Clinic  Name:  Lennin Osmond    MRN: [id number] DOB: 21-Jun-1946  10/20/2019  Mr. Sitzer was observed post Covid-19 immunization for 15 minutes without incidence. He was provided with Vaccine Information Sheet and instruction to access the V-Safe system.   Mr. Suriano was instructed to call 911 with any severe reactions post vaccine: Marland Kitchen Difficulty breathing  . Swelling of your face and throat  . A fast heartbeat  . A bad rash all over your body  . Dizziness and weakness    Immunizations Administered    Name Date Dose VIS Date Route   Pfizer COVID-19 Vaccine 10/20/2019 10:16 AM 0.3 mL 09/07/2019 Intramuscular   Manufacturer: [employer], [employer]   Lot: [id number]   NDC: [address]-7

## 2019-11-05 ENCOUNTER — Ambulatory Visit: Payer: BC Managed Care – PPO

## 2019-11-09 ENCOUNTER — Other Ambulatory Visit (HOSPITAL_BASED_OUTPATIENT_CLINIC_OR_DEPARTMENT_OTHER): Payer: BC Managed Care – PPO

## 2019-11-10 ENCOUNTER — Ambulatory Visit: Payer: BC Managed Care – PPO | Attending: Internal Medicine

## 2019-11-10 DIAGNOSIS — Z23 Encounter for immunization: Secondary | ICD-10-CM

## 2019-11-10 NOTE — Progress Notes (Signed)
   Covid-19 Vaccination Clinic  Name:  Andrew Hurst    MRN: [id number] DOB: [birth year]  11/10/2019  [name] was observed post Covid-19 immunization for  15 minutes without incidence. He was provided with Vaccine Information Sheet and instruction to access the V-Safe system.   [name] was instructed to call 911 with any severe reactions post vaccine: Marland Kitchen Difficulty breathing  . Swelling of your face and throat  . A fast heartbeat  . A bad rash all over your body  . Dizziness and weakness    Immunizations Administered    Name Date Dose VIS Date Route   Pfizer COVID-19 Vaccine 11/10/2019 10:01 AM 0.3 mL 09/07/2019 Intramuscular   Manufacturer: [employer], [employer]   Lot: [id number]   NDC: [address]-8

## 2019-11-28 DIAGNOSIS — K645 Perianal venous thrombosis: Secondary | ICD-10-CM | POA: Diagnosis not present

## 2020-01-01 ENCOUNTER — Other Ambulatory Visit: Payer: Self-pay

## 2020-01-01 ENCOUNTER — Ambulatory Visit
Admission: EM | Admit: 2020-01-01 | Discharge: 2020-01-01 | Disposition: A | Discharge status: Home / Self Care | Payer: BC Managed Care – PPO | Attending: Emergency Medicine | Admitting: Emergency Medicine

## 2020-01-01 ENCOUNTER — Encounter: Payer: Self-pay | Admitting: Emergency Medicine

## 2020-01-01 DIAGNOSIS — M654 Radial styloid tenosynovitis [de Quervain]: Secondary | ICD-10-CM

## 2020-01-01 MED ORDER — NAPROXEN 500 MG PO TABS: 500.0000 mg | ORAL_TABLET | Freq: Two times a day (BID) | ORAL | 0 refills | Status: DC

## 2020-01-01 NOTE — Discharge Instructions (Addendum)
Recommend RICE: rest, ice, compression, elevation as needed for pain.   Cold therapy (ice packs) can be used to help swelling both after injury and after prolonged use of areas of chronic pain/aches.  For pain: Present, may take Tylenol as needed.  Important to eat food prior to taking medication.   Return for worsening pain, swelling, discoloration, numbness.

## 2020-01-01 NOTE — ED Triage Notes (Signed)
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

## 2020-01-01 NOTE — ED Provider Notes (Signed)
[facility]    CSN: [id number] Arrival date & time: 01/01/20  3903      History   Chief Complaint Chief Complaint  Patient presents with  . Wrist Pain    HPI Andrew Hurst is a 74 y.o. be dexterous male with history of BPH presenting for right wrist pain.  Patient states is been ongoing for last 2 months.  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  Has a wrist brace at home which he uses with some relief.  Has also tried icy hot gel without relief.  Tried ibuprofen last week with some relief.  Patient denying distal extremity numbness, weakness.  Patient states when it flares gets tender to touch.  Denies injury/trauma to the area.   Past Medical History:  Diagnosis Date  . Benign non-nodular prostatic hyperplasia with lower urinary tract symptoms   . BPH with elevated PSA     Patient Active Problem List   Diagnosis Date Noted  . Heart murmur 04/03/2019  . Dyspnea on exertion 04/03/2019  . Borderline diabetes 04/03/2019  . History of colonic polyps 09/23/2017  . Benign prostatic hyperplasia 07/17/2014  . Hearing loss of left ear 07/17/2014  . Hyperglycemia 07/17/2014  . Elevated PSA, less than 10 ng/ml 10/29/2013    Past Surgical History:  Procedure Laterality Date  . HERNIA REPAIR         Home Medications    Prior to Admission medications   Medication Sig Start Date End Date Taking? Authorizing Provider  naproxen (NAPROSYN) 500 MG tablet Take 1 tablet (500 mg total) by mouth 2 (two) times daily. 01/01/20   Hall-Potvin, [place], PA-C  tamsulosin (FLOMAX) 0.4 MG CAPS capsule Take 0.4 mg by mouth. 3 times a week    [provider]    Family History Family History  Problem Relation Age of Onset  . Kidney disease Mother   . COPD Father   . Cancer Brother 49       colon cancer    Social History Social History   Tobacco Use  . Smoking status: Never Smoker  . Smokeless tobacco: Never  Used  Substance Use Topics  . Alcohol use: No  . Drug use: No     Allergies   Patient has no known allergies.   Review of Systems As per HPI   Physical Exam Triage Vital Signs ED Triage Vitals  Enc Vitals Group     BP      Pulse      Resp      Temp      Temp src      SpO2      Weight      Height      Head Circumference      Peak Flow      Pain Score      Pain Loc      Pain Edu?      Excl. in [place]?    No data found.  Updated Vital Signs BP (!) 149/71 (BP Location: Left Arm)   Pulse 61   Temp 97.6 F (36.4 C) (Oral)   Resp 16   SpO2 96%   Visual Acuity Right Eye Distance:   Left Eye Distance:   Bilateral Distance:    Right Eye Near:   Left Eye Near:    Bilateral Near:     Physical Exam Constitutional:      General: He is not in acute  distress. HENT:     Head: Normocephalic and atraumatic.  Eyes:     General: No scleral icterus.    Pupils: Pupils are equal, round, and reactive to light.  Cardiovascular:     Rate and Rhythm: Normal rate.  Pulmonary:     Effort: Pulmonary effort is normal. No respiratory distress.     Breath sounds: No wheezing.  Musculoskeletal:     Right wrist: Tenderness present. No swelling, deformity, effusion, bony tenderness or crepitus. Normal range of motion. Normal pulse.     Left wrist: Normal.     Comments: Negative Tinel's, negative Phalen's, positive Finkelstein's  Skin:    Coloration: Skin is not jaundiced or pale.  Neurological:     Mental Status: He is alert and oriented to person, place, and time.      UC Treatments / Results  Labs (all labs ordered are listed, but only abnormal results are displayed) Labs Reviewed - No data to display  EKG   Radiology No results found.  Procedures Procedures (including critical care time)  Medications Ordered in UC Medications - No data to display  Initial Impression / Assessment and Plan / UC Course  I have reviewed the triage vital signs and the nursing  notes.  Pertinent labs & imaging results that were available during my care of the patient were reviewed by me and considered in my medical decision making (see chart for details).     H&P consistent with de Quervain's tenosynovitis.  Patient already has wrist brace at home.  Has not been working for the last few weeks which is helped.  Patient denying needing work note today.  Will trial short-term naproxen for when he does return to work.  Stressed importance of icing after prolonged use, wearing brace at work.  Provided orthopedic office contact information for further evaluation/management if needed.  Return precautions discussed, patient verbalized understanding and is agreeable to plan. Final Clinical Impressions(s) / UC Diagnoses   Final diagnoses:  De Quervain's tenosynovitis, right     Discharge Instructions     Recommend RICE: rest, ice, compression, elevation as needed for pain.   Cold therapy (ice packs) can be used to help swelling both after injury and after prolonged use of areas of chronic pain/aches.  For pain: Present, may take Tylenol as needed.  Important to eat food prior to taking medication.   Return for worsening pain, swelling, discoloration, numbness.    ED Prescriptions    Medication Sig Dispense Auth. Provider   naproxen (NAPROSYN) 500 MG tablet Take 1 tablet (500 mg total) by mouth 2 (two) times daily. 30 tablet Hall-Potvin, [place], PA-C     PDMP not reviewed this encounter.   Hall-Potvin, [place], [place] 01/01/20 [id number]

## 2020-06-24 ENCOUNTER — Telehealth: Payer: Self-pay | Admitting: Family Medicine

## 2020-06-24 NOTE — Telephone Encounter (Signed)
Patient is wanting Dr.Santiago to give him a call  Pt an be reached at [phone]/ patient states provider can leave a number where she can contact him

## 2020-06-24 NOTE — Telephone Encounter (Signed)
LVM  for the pt to cb to see if I could help him with any questions/concerns that he may have so I can get them to [place].

## 2020-06-30 ENCOUNTER — Ambulatory Visit: Payer: Self-pay | Admitting: Family Medicine

## 2020-10-30 DIAGNOSIS — N138 Other obstructive and reflux uropathy: Secondary | ICD-10-CM | POA: Diagnosis not present

## 2020-10-30 DIAGNOSIS — R972 Elevated prostate specific antigen [PSA]: Secondary | ICD-10-CM | POA: Diagnosis not present

## 2020-10-30 DIAGNOSIS — N401 Enlarged prostate with lower urinary tract symptoms: Secondary | ICD-10-CM | POA: Diagnosis not present

## 2021-05-29 ENCOUNTER — Emergency Department (HOSPITAL_COMMUNITY)
Admission: EM | Admit: 2021-05-29 | Discharge: 2021-05-29 | Disposition: A | Discharge status: Home / Self Care | Payer: Medicare Other | Attending: Emergency Medicine | Admitting: Emergency Medicine

## 2021-05-29 ENCOUNTER — Ambulatory Visit
Admission: EM | Admit: 2021-05-29 | Discharge: 2021-05-29 | Disposition: A | Discharge status: Home / Self Care | Payer: Medicare Other | Attending: Urgent Care | Admitting: Urgent Care

## 2021-05-29 ENCOUNTER — Other Ambulatory Visit: Payer: Self-pay

## 2021-05-29 ENCOUNTER — Emergency Department (HOSPITAL_COMMUNITY): Payer: Medicare Other

## 2021-05-29 ENCOUNTER — Encounter (HOSPITAL_COMMUNITY): Payer: Self-pay

## 2021-05-29 DIAGNOSIS — R0981 Nasal congestion: Secondary | ICD-10-CM | POA: Insufficient documentation

## 2021-05-29 DIAGNOSIS — J069 Acute upper respiratory infection, unspecified: Secondary | ICD-10-CM

## 2021-05-29 DIAGNOSIS — Z5321 Procedure and treatment not carried out due to patient leaving prior to being seen by health care provider: Secondary | ICD-10-CM | POA: Diagnosis not present

## 2021-05-29 DIAGNOSIS — R070 Pain in throat: Secondary | ICD-10-CM

## 2021-05-29 DIAGNOSIS — R059 Cough, unspecified: Secondary | ICD-10-CM

## 2021-05-29 DIAGNOSIS — Z20822 Contact with and (suspected) exposure to covid-19: Secondary | ICD-10-CM | POA: Insufficient documentation

## 2021-05-29 LAB — RESP PANEL BY RT-PCR (FLU A&B, COVID) ARPGX2
Influenza A by PCR: NEGATIVE
Influenza B by PCR: NEGATIVE
SARS Coronavirus 2 by RT PCR: NEGATIVE

## 2021-05-29 LAB — COMPREHENSIVE METABOLIC PANEL
ALT: 20 U/L (ref 0–44)
AST: 21 U/L (ref 15–41)
Albumin: 3.3 g/dL — ABNORMAL LOW (ref 3.5–5.0)
Alkaline Phosphatase: 54 U/L (ref 38–126)
Anion gap: 7 (ref 5–15)
BUN: 17 mg/dL (ref 8–23)
CO2: 23 mmol/L (ref 22–32)
Calcium: 8.4 mg/dL — ABNORMAL LOW (ref 8.9–10.3)
Chloride: 107 mmol/L (ref 98–111)
Creatinine, Ser: 1.21 mg/dL (ref 0.61–1.24)
GFR, Estimated: >60 mL/min (ref >60)
Glucose, Bld: 98 mg/dL (ref 70–99)
Potassium: 4 mmol/L (ref 3.5–5.1)
Sodium: 137 mmol/L (ref 135–145)
Total Bilirubin: 0.9 mg/dL (ref 0.3–1.2)
Total Protein: 5.8 g/dL — ABNORMAL LOW (ref 6.5–8.1)

## 2021-05-29 LAB — CBC WITH DIFFERENTIAL/PLATELET
Abs Immature Granulocytes: 0.03 10*3/uL (ref 0.00–0.07)
Basophils Absolute: 0.1 10*3/uL (ref 0.0–0.1)
Basophils Relative: 1 %
Eosinophils Absolute: 0.3 10*3/uL (ref 0.0–0.5)
Eosinophils Relative: 3 %
HCT: 39.5 % (ref 39.0–52.0)
Hemoglobin: 13.1 g/dL (ref 13.0–17.0)
Immature Granulocytes: 0 %
Lymphocytes Relative: 21 %
Lymphs Abs: 2.1 10*3/uL (ref 0.7–4.0)
MCH: 32.8 pg (ref 26.0–34.0)
MCHC: 33.2 g/dL (ref 30.0–36.0)
MCV: 98.8 fL (ref 80.0–100.0)
Monocytes Absolute: 1.1 10*3/uL — ABNORMAL HIGH (ref 0.1–1.0)
Monocytes Relative: 11 %
Neutro Abs: 6.4 10*3/uL (ref 1.7–7.7)
Neutrophils Relative %: 64 %
Platelets: 202 10*3/uL (ref 150–400)
RBC: 4 MIL/uL — ABNORMAL LOW (ref 4.22–5.81)
RDW: 12.3 % (ref 11.5–15.5)
WBC: 10 10*3/uL (ref 4.0–10.5)
nRBC: 0 % (ref 0.0–0.2)

## 2021-05-29 MED ORDER — CETIRIZINE HCL 5 MG PO TABS: 5.0000 mg | ORAL_TABLET | Freq: Every day | ORAL | 0 refills | Status: DC

## 2021-05-29 MED ORDER — BENZONATATE 100 MG PO CAPS: 100.0000 mg | ORAL_CAPSULE | Freq: Three times a day (TID) | ORAL | 0 refills | Status: DC | PRN

## 2021-05-29 MED ORDER — PROMETHAZINE-DM 6.25-15 MG/5ML PO SYRP: 5.0000 mL | ORAL_SOLUTION | Freq: Every evening | ORAL | 0 refills | Status: DC | PRN

## 2021-05-29 MED ORDER — PSEUDOEPHEDRINE HCL 30 MG PO TABS: 30.0000 mg | ORAL_TABLET | Freq: Three times a day (TID) | ORAL | 0 refills | Status: DC | PRN

## 2021-05-29 NOTE — ED Triage Notes (Signed)
Pt reports chest congestion and cough since yesterday, voice is muffled. Has received his COVID vaccines. Denies chest pain or SOB.

## 2021-05-29 NOTE — ED Triage Notes (Signed)
Pt c/o cough, sore throat, runny nose, mild headache. Denies nausea or vomiting, constipation, body aches or chills.

## 2021-05-29 NOTE — ED Provider Notes (Signed)
[facility]   MRN: [id number] DOB: 12/01/1945  Subjective:   Andrew Hurst is a 75 y.o. male presenting for 2-day history of productive cough, throat pain, runny nose, mild headache.  No fever, bodies, chills, chest pain, shortness of breath.  Patient initially went to the emergency room and left without being seen.  He did have testing done, results are as below.  No current facility-administered medications for this encounter.  Current Outpatient Medications:    naproxen (NAPROSYN) 500 MG tablet, Take 1 tablet (500 mg total) by mouth 2 (two) times daily., Disp: 30 tablet, Rfl: 0   tamsulosin (FLOMAX) 0.4 MG CAPS capsule, Take 0.4 mg by mouth. 3 times a week, Disp: , Rfl:    No Known Allergies  Past Medical History:  Diagnosis Date   Benign non-nodular prostatic hyperplasia with lower urinary tract symptoms    BPH with elevated PSA      Past Surgical History:  Procedure Laterality Date   HERNIA REPAIR      Family History  Problem Relation Age of Onset   Kidney disease Mother    COPD Father    Cancer Brother 42       colon cancer    Social History   Tobacco Use   Smoking status: Never   Smokeless tobacco: Never  Vaping Use   Vaping Use: Never used  Substance Use Topics   Alcohol use: No   Drug use: No    ROS   Objective:   Vitals: BP (!) 148/82 (BP Location: Left Arm)   Pulse 76   Temp 98 F (36.7 C) (Oral)   Resp 18   SpO2 98%   Physical Exam Constitutional:      General: He is not in acute distress.    Appearance: Normal appearance. He is well-developed and normal weight. He is not ill-appearing, toxic-appearing or diaphoretic.  HENT:     Head: Normocephalic and atraumatic.     Right Ear: Tympanic membrane, ear canal and external ear normal. There is no impacted cerumen.     Left Ear: Tympanic membrane, ear canal and external ear normal. There is no impacted cerumen.     Nose: Congestion present. No rhinorrhea.      Mouth/Throat:     Mouth: Mucous membranes are moist.     Pharynx: No oropharyngeal exudate or posterior oropharyngeal erythema.  Eyes:     General: No scleral icterus.       Right eye: No discharge.        Left eye: No discharge.     Extraocular Movements: Extraocular movements intact.     Conjunctiva/sclera: Conjunctivae normal.     Pupils: Pupils are equal, round, and reactive to light.  Cardiovascular:     Rate and Rhythm: Normal rate and regular rhythm.     Heart sounds: Normal heart sounds. No murmur heard.   No friction rub. No gallop.  Pulmonary:     Effort: Pulmonary effort is normal. No respiratory distress.     Breath sounds: Normal breath sounds. No stridor. No wheezing, rhonchi or rales.  Musculoskeletal:     Cervical back: Normal range of motion and neck supple. No rigidity. No muscular tenderness.  Neurological:     General: No focal deficit present.     Mental Status: He is alert and oriented to person, place, and time.  Psychiatric:        Mood and Affect: Mood normal.        Behavior: Behavior  normal.        Thought Content: Thought content normal.    Results for orders placed or performed during the hospital encounter of 05/29/21 (from the past 24 hour(s))  CBC with Differential     Status: Abnormal   Collection Time: 05/29/21  1:12 PM  Result Value Ref Range   WBC 10.0 4.0 - 10.5 K/uL   RBC 4.00 (L) 4.22 - 5.81 MIL/uL   Hemoglobin 13.1 13.0 - 17.0 g/dL   HCT 01.7 51.0 - 25.8 %   MCV 98.8 80.0 - 100.0 fL   MCH 32.8 26.0 - 34.0 pg   MCHC 33.2 30.0 - 36.0 g/dL   RDW 52.7 78.2 - 42.3 %   Platelets 202 150 - 400 K/uL   nRBC 0.0 0.0 - 0.2 %   Neutrophils Relative % 64 %   Neutro Abs 6.4 1.7 - 7.7 K/uL   Lymphocytes Relative 21 %   Lymphs Abs 2.1 0.7 - 4.0 K/uL   Monocytes Relative 11 %   Monocytes Absolute 1.1 (H) 0.1 - 1.0 K/uL   Eosinophils Relative 3 %   Eosinophils Absolute 0.3 0.0 - 0.5 K/uL   Basophils Relative 1 %   Basophils Absolute 0.1 0.0 -  0.1 K/uL   Immature Granulocytes 0 %   Abs Immature Granulocytes 0.03 0.00 - 0.07 K/uL  Comprehensive metabolic panel     Status: Abnormal   Collection Time: 05/29/21  1:12 PM  Result Value Ref Range   Sodium 137 135 - 145 mmol/L   Potassium 4.0 3.5 - 5.1 mmol/L   Chloride 107 98 - 111 mmol/L   CO2 23 22 - 32 mmol/L   Glucose, Bld 98 70 - 99 mg/dL   BUN 17 8 - 23 mg/dL   Creatinine, Ser 5.36 0.61 - 1.24 mg/dL   Calcium 8.4 (L) 8.9 - 10.3 mg/dL   Total Protein 5.8 (L) 6.5 - 8.1 g/dL   Albumin 3.3 (L) 3.5 - 5.0 g/dL   AST 21 15 - 41 U/L   ALT 20 0 - 44 U/L   Alkaline Phosphatase 54 38 - 126 U/L   Total Bilirubin 0.9 0.3 - 1.2 mg/dL   GFR, Estimated >14 >43 mL/min   Anion gap 7 5 - 15  Resp Panel by RT-PCR (Flu A&B, Covid) Nasopharyngeal Swab     Status: None   Collection Time: 05/29/21  1:15 PM   Specimen: Nasopharyngeal Swab; Nasopharyngeal(NP) swabs in vial transport medium  Result Value Ref Range   SARS Coronavirus 2 by RT PCR NEGATIVE NEGATIVE   Influenza A by PCR NEGATIVE NEGATIVE   Influenza B by PCR NEGATIVE NEGATIVE    Assessment and Plan :   PDMP not reviewed this encounter.  1. Viral upper respiratory illness   2. Cough   3. Throat pain     Suspect viral URI, viral syndrome; physical exam findings reassuring and vital signs stable for discharge. Advised supportive care, offered symptomatic relief. Counseled patient on potential for adverse effects with medications prescribed/recommended today, ER and return-to-clinic precautions discussed, patient verbalized understanding.     Wallis Bamberg, [place] 05/29/21 1540

## 2021-05-29 NOTE — ED Provider Notes (Signed)
Emergency Medicine Provider Triage Evaluation Note  Andrew Hurst , a 75 y.o. male  was evaluated in triage.  Pt complains of congestion and productive cough since yesterday.   Review of Systems  Positive: Cough, congestion Negative: Chest pain, SOB, fever  Physical Exam  BP (!) 161/76 (BP Location: Left Arm)   Pulse 77   Temp 98.3 F (36.8 C) (Oral)   Resp 18   Ht 5\' 7"  (1.702 m)   Wt 77.6 kg   SpO2 96%   BMI 26.78 kg/m  Gen:   Awake, no distress   Resp:  Normal effort  MSK:   Moves extremities without difficulty  Other:    Medical Decision Making  Medically screening exam initiated at 1:10 PM.  Appropriate orders placed.  was informed that the remainder of the evaluation will be completed by another provider, this initial triage assessment does not replace that evaluation, and the importance of remaining in the ED until their evaluation is complete.  CBC, CMP, CXR, COVID test   Kerry Kass, PA-C 05/29/21 1315    07/29/21, MD 05/29/21 [phone]

## 2022-11-11 DIAGNOSIS — R972 Elevated prostate specific antigen [PSA]: Secondary | ICD-10-CM | POA: Diagnosis not present

## 2022-11-11 DIAGNOSIS — N400 Enlarged prostate without lower urinary tract symptoms: Secondary | ICD-10-CM | POA: Diagnosis not present

## 2022-11-12 ENCOUNTER — Encounter: Payer: Self-pay | Admitting: Family

## 2022-11-12 ENCOUNTER — Ambulatory Visit (INDEPENDENT_AMBULATORY_CARE_PROVIDER_SITE_OTHER): Payer: Medicare HMO | Admitting: Family

## 2022-11-12 VITALS — BP 135/63 | HR 62 | Temp 98.3°F | Resp 16 | Ht 66.0 in | Wt 172.0 lb

## 2022-11-12 DIAGNOSIS — Z131 Encounter for screening for diabetes mellitus: Secondary | ICD-10-CM | POA: Diagnosis not present

## 2022-11-12 DIAGNOSIS — Z13228 Encounter for screening for other metabolic disorders: Secondary | ICD-10-CM | POA: Diagnosis not present

## 2022-11-12 DIAGNOSIS — Z1329 Encounter for screening for other suspected endocrine disorder: Secondary | ICD-10-CM | POA: Diagnosis not present

## 2022-11-12 DIAGNOSIS — Z0000 Encounter for general adult medical examination without abnormal findings: Secondary | ICD-10-CM | POA: Diagnosis not present

## 2022-11-12 DIAGNOSIS — Z13220 Encounter for screening for lipoid disorders: Secondary | ICD-10-CM | POA: Diagnosis not present

## 2022-11-12 DIAGNOSIS — Z130 Encounter for screening for diseases of the blood and blood-forming organs and certain disorders involving the immune mechanism: Secondary | ICD-10-CM

## 2022-11-12 DIAGNOSIS — K645 Perianal venous thrombosis: Secondary | ICD-10-CM | POA: Insufficient documentation

## 2022-11-12 DIAGNOSIS — Z800 Family history of malignant neoplasm of digestive organs: Secondary | ICD-10-CM | POA: Insufficient documentation

## 2022-11-12 DIAGNOSIS — K929 Disease of digestive system, unspecified: Secondary | ICD-10-CM | POA: Insufficient documentation

## 2022-11-12 DIAGNOSIS — Z1211 Encounter for screening for malignant neoplasm of colon: Secondary | ICD-10-CM | POA: Insufficient documentation

## 2022-11-12 NOTE — Progress Notes (Signed)
Subjective:   Andrew Hurst is a 77 y.o. male who presents for Medicare Annual/Subsequent preventive examination.  Review of Systems    Defer to PCP  Cardiac Risk Factors include: advanced age (>68mn, >>6women);male gender     Objective:    Today's Vitals   11/12/22 1520 11/12/22 1534  BP: (!) 148/72 135/63  Pulse: 62   Resp: 16   Temp: 98.3 F (36.8 C)   SpO2: 96%   Weight: 172 lb (78 kg)   Height: 5' 6"$  (1.676 m)   PainSc: 0-No pain    Body mass index is 27.76 kg/m.     11/12/2022    3:22 PM  Advanced Directives  Does Patient Have a Medical Advance Directive? No  Would patient like information on creating a medical advance directive? Yes (Inpatient - patient defers creating a medical advance directive at this time - Information given)    Current Medications (verified) Outpatient Encounter Medications as of 11/12/2022  Medication Sig   tamsulosin (FLOMAX) 0.4 MG CAPS capsule Take 0.4 mg by mouth. 3 times a week   [DISCONTINUED] benzonatate (TESSALON) 100 MG capsule Take 1-2 capsules (100-200 mg total) by mouth 3 (three) times daily as needed.   [DISCONTINUED] cetirizine (ZYRTEC) 5 MG tablet Take 1 tablet (5 mg total) by mouth daily.   [DISCONTINUED] naproxen (NAPROSYN) 500 MG tablet Take 1 tablet (500 mg total) by mouth 2 (two) times daily.   [DISCONTINUED] promethazine-dextromethorphan (PROMETHAZINE-DM) 6.25-15 MG/5ML syrup Take 5 mLs by mouth at bedtime as needed for cough.   [DISCONTINUED] pseudoephedrine (SUDAFED) 30 MG tablet Take 1 tablet (30 mg total) by mouth every 8 (eight) hours as needed for congestion.   No facility-administered encounter medications on file as of 11/12/2022.    Allergies (verified) Patient has no known allergies.   History: Past Medical History:  Diagnosis Date   Benign non-nodular prostatic hyperplasia with lower urinary tract symptoms    BPH with elevated PSA    Past Surgical History:  Procedure Laterality Date   HERNIA  REPAIR     Family History  Problem Relation Age of Onset   Kidney disease Mother    COPD Father    Cancer Brother 634      colon cancer   Social History   Socioeconomic History   Marital status: Widowed    Spouse name: n/a   Number of children: 2   Years of education: 15   Highest education level: Not on file  Occupational History   Occupation: SChemical engineer [employer]  Tobacco Use   Smoking status: Never    Passive exposure: Never   Smokeless tobacco: Never  Vaping Use   Vaping Use: Never used  Substance and Sexual Activity   Alcohol use: No   Drug use: No   Sexual activity: Yes    Partners: Female    Birth control/protection: Condom    Comment: "not that much"  Other Topics Concern   Not on file  Social History Narrative   Lives alone. Widowed. Wife died in 120of metastatic breast cancer at age [age]years. Education: college. Pt. Does exercise[place]   Social Determinants of Health   Financial Resource Strain: Not on file  Food Insecurity: Not on file  Transportation Needs: Not on file  Physical Activity: Not on file  Stress: Not on file  Social Connections: Not on file    Tobacco Counseling Counseling  given: Not Answered   Clinical Intake:  Pre-visit preparation completed: Yes  Pain : 0-10 Pain Score: 0-No pain     Diabetes: No  How often do you need to have someone help you when you read instructions, pamphlets, or other written materials from your doctor or pharmacy?: 1 - Never  Diabetic?No   Interpreter Needed?: No      Activities of Daily Living    11/12/2022    3:22 PM  In your present state of health, do you have any difficulty performing the following activities:  Hearing? 0  Vision? 0  Difficulty concentrating or making decisions? 0  Walking or climbing stairs? 0  Dressing or bathing? 0  Doing errands, shopping? 0  Preparing Food and eating ? N  Using the Toilet? N   In the past six months, have you accidently leaked urine? N  Do you have problems with loss of bowel control? N  Managing your Medications? N  Managing your Finances? N  Housekeeping or managing your Housekeeping? N    Patient Care Team: Pcp, No as PCP - General Juanita Craver, MD as Consulting Physician (Gastroenterology) Moul, Carmin Richmond, MD as Referring Physician (Urology)  Indicate any recent Medical Services you may have received from other than [facility] providers in the past year (date may be approximate).     Assessment:   This is a routine wellness examination for [facility] [employer].  Hearing/Vision screen No results found.  Dietary issues and exercise activities discussed: Current Exercise Habits: The patient does not participate in regular exercise at present, Exercise limited by: None identified   Goals Addressed   None   Depression Screen    11/12/2022    3:22 PM 05/09/2018    9:22 AM 05/06/2018    8:13 AM 06/15/2017    8:32 AM 07/28/2016    8:16 AM 03/19/2016    6:11 PM 02/02/2016   10:46 AM  PHQ 2/9 Scores  PHQ - 2 Score 0 0 0 2 0 0 0  PHQ- 9 Score 0   6       Fall Risk    11/12/2022    3:22 PM 03/28/2019    2:23 PM 05/09/2018    9:22 AM 05/06/2018    8:13 AM 06/15/2017    8:32 AM  Fall Risk   Falls in the past year? 0 0 No Yes No  Comment    05/06/2018   Number falls in past yr: 0 0  1   Comment    left leg   Injury with Fall? 0 0  Yes   Risk for fall due to : No Fall Risks      Follow up Falls evaluation completed        [place]:  Any stairs in or around the home? No  If so, are there any without handrails? No  Home free of loose throw rugs in walkways, pet beds, electrical cords, etc? Yes  Adequate lighting in your home to reduce risk of falls? Yes   ASSISTIVE DEVICES UTILIZED TO PREVENT FALLS:  Life alert? No  Use of a cane, walker or w/c? No  Grab bars in the bathroom? No  Shower chair or bench in shower? No  Elevated  toilet seat or a handicapped toilet? No   TIMED UP AND GO:  Was the test performed? .  Length of time to ambulate 10 feet:  sec.     Cognitive Function:    11/12/2022  3:22 PM  MMSE - Mini Mental State Exam  Orientation to time 5  Orientation to Place 5  Registration 3  Attention/ Calculation 5  Recall 3  Language- name 2 objects 2  Language- repeat 1  Language- follow 3 step command 3  Language- read & follow direction 1  Write a sentence 1  Copy design 1  Total score 30        11/12/2022    3:23 PM  6CIT Screen  What Year? 0 points  What month? 0 points  What time? 0 points  Count back from 20 0 points  Months in reverse 0 points  Repeat phrase 0 points  Total Score 0 points    Immunizations Immunization History  Administered Date(s) Administered   PFIZER(Purple Top)SARS-COV-2 Vaccination 10/20/2019, 11/10/2019   Pneumococcal Polysaccharide-23 07/17/2013   Tdap 07/17/2014    TDAP status: Up to date  Flu Vaccine status: Declined, Education has been provided regarding the importance of this vaccine but patient still declined. Advised may receive this vaccine at local pharmacy or Health Dept. Aware to provide a copy of the vaccination record if obtained from local pharmacy or Health Dept. Verbalized acceptance and understanding.  Pneumococcal vaccine status: Up to date  Covid-19 vaccine status: Completed vaccines  Qualifies for Shingles Vaccine? Yes   Zostavax completed No   Shingrix Completed?: No.    Education has been provided regarding the importance of this vaccine. Patient has been advised to call insurance company to determine out of pocket expense if they have not yet received this vaccine. Advised may also receive vaccine at local pharmacy or Health Dept. Verbalized acceptance and understanding.  Screening Tests Health Maintenance  Topic Date Due   COVID-19 Vaccine (3 - 2023-24 season) 05/28/2022   INFLUENZA VACCINE  12/26/2022 (Originally  04/27/2022)   Zoster Vaccines- Shingrix (1 of 2) 02/10/2023 (Originally 01/17/1994)   Pneumonia Vaccine 13+ Years old (2 of 2 - PCV) 11/13/2023 (Originally 07/17/2014)   Medicare Annual Wellness (AWV)  11/13/2023   DTaP/Tdap/Td (2 - Td or Tdap) 07/17/2024   Hepatitis C Screening  Completed   HPV VACCINES  Aged Out   COLONOSCOPY (Pts 45-34yr Insurance coverage will need to be confirmed)  Discontinued    Health Maintenance  Health Maintenance Due  Topic Date Due   COVID-19 Vaccine (3 - 2023-24 season) 05/28/2022    Colorectal cancer screening: Type of screening: Colonoscopy. Completed 09/16/2017. Repeat every 0 years  Lung Cancer Screening: (Low Dose CT Chest recommended if Age 489-80years, 30 pack-year currently smoking OR have quit w/in 15years.) does not qualify.   Lung Cancer Screening Referral: N/A  Additional Screening:  Hepatitis C Screening: does qualify; Completed 07/17/2013  Vision Screening: Recommended annual ophthalmology exams for early detection of glaucoma and other disorders of the eye. Is the patient up to date with their annual eye exam?  Yes  Who is the provider or what is the name of the office in which the patient attends annual eye exams? Dr KVivianne Spence [facility]  If pt is not established with a provider, would they like to be referred to a provider to establish care? No .   Dental Screening: Recommended annual dental exams for proper oral hygiene  Community Resource Referral / Chronic Care Management: CRR required this visit?  No   CCM required this visit?  No      Plan:     I have personally reviewed and noted the following in the patient's  chart:   Medical and social history Use of alcohol, tobacco or illicit drugs  Current medications and supplements including opioid prescriptions. Patient is not currently taking opioid prescriptions. Functional ability and status Nutritional status Physical activity Advanced directives List of other  physicians Hospitalizations, surgeries, and ER visits in previous 12 months Vitals Screenings to include cognitive, depression, and falls Referrals and appointments  In addition, I have reviewed and discussed with patient certain preventive protocols, quality metrics, and best practice recommendations. A written personalized care plan for preventive services as well as general preventive health recommendations were provided to patient.     Elmon Else, [facility]   11/12/2022

## 2022-11-12 NOTE — Progress Notes (Signed)
Subjective:   Andrew Hurst is a 77 y.o. male who presents for Medicare Annual/Subsequent preventive examination.  Review of Systems    No issues/concerns for discussion today.   Cardiac Risk Factors include: advanced age (>60mn, >>62women);male gender  Objective:    Today's Vitals   11/12/22 1520 11/12/22 1534  BP: (!) 148/72 135/63  Pulse: 62   Resp: 16   Temp: 98.3 F (36.8 C)   SpO2: 96%   Weight: 172 lb (78 kg)   Height: 5' 6"$  (1.676 m)   PainSc: 0-No pain    Body mass index is 27.76 kg/m.  Physical Exam HENT:     Head: Normocephalic and atraumatic.     Right Ear: Tympanic membrane, ear canal and external ear normal.     Left Ear: Tympanic membrane, ear canal and external ear normal.     Nose: Nose normal.     Mouth/Throat:     Mouth: Mucous membranes are moist.     Pharynx: Oropharynx is clear.  Eyes:     Extraocular Movements: Extraocular movements intact.     Conjunctiva/sclera: Conjunctivae normal.     Pupils: Pupils are equal, round, and reactive to light.  Cardiovascular:     Rate and Rhythm: Normal rate and regular rhythm.     Pulses: Normal pulses.     Heart sounds: Normal heart sounds.  Pulmonary:     Effort: Pulmonary effort is normal.     Breath sounds: Normal breath sounds.  Abdominal:     General: Bowel sounds are normal.     Palpations: Abdomen is soft.  Genitourinary:    Comments: Patient declined.  Musculoskeletal:        General: Normal range of motion.     Right shoulder: Normal.     Left shoulder: Normal.     Right upper arm: Normal.     Left upper arm: Normal.     Right elbow: Normal.     Left elbow: Normal.     Right forearm: Normal.     Left forearm: Normal.     Right wrist: Normal.     Left wrist: Normal.     Right hand: Normal.     Left hand: Normal.     Cervical back: Normal, normal range of motion and neck supple.     Thoracic back: Normal.     Lumbar back: Normal.     Right hip: Normal.     Left hip: Normal.      Right upper leg: Normal.     Left upper leg: Normal.     Right knee: Normal.     Left knee: Normal.     Right lower leg: Normal.     Left lower leg: Normal.     Right ankle: Normal.     Left ankle: Normal.     Right foot: Normal.     Left foot: Normal.  Skin:    General: Skin is warm and dry.     Capillary Refill: Capillary refill takes less than 2 seconds.  Neurological:     General: No focal deficit present.     Mental Status: He is alert and oriented to person, place, and time.  Psychiatric:        Mood and Affect: Mood normal.        Behavior: Behavior normal.        11/12/2022    3:22 PM  Advanced Directives  Does Patient Have a Medical Advance Directive? No  Would patient like information on creating a medical advance directive? Yes (Inpatient - patient defers creating a medical advance directive at this time - Information given)   Current Medications (verified) Outpatient Encounter Medications as of 11/12/2022  Medication Sig   tamsulosin (FLOMAX) 0.4 MG CAPS capsule Take 0.4 mg by mouth. 3 times a week   [DISCONTINUED] benzonatate (TESSALON) 100 MG capsule Take 1-2 capsules (100-200 mg total) by mouth 3 (three) times daily as needed.   [DISCONTINUED] cetirizine (ZYRTEC) 5 MG tablet Take 1 tablet (5 mg total) by mouth daily.   [DISCONTINUED] naproxen (NAPROSYN) 500 MG tablet Take 1 tablet (500 mg total) by mouth 2 (two) times daily.   [DISCONTINUED] promethazine-dextromethorphan (PROMETHAZINE-DM) 6.25-15 MG/5ML syrup Take 5 mLs by mouth at bedtime as needed for cough.   [DISCONTINUED] pseudoephedrine (SUDAFED) 30 MG tablet Take 1 tablet (30 mg total) by mouth every 8 (eight) hours as needed for congestion.   No facility-administered encounter medications on file as of 11/12/2022.    Allergies (verified) Patient has no known allergies.   History: Past Medical History:  Diagnosis Date   Benign non-nodular prostatic hyperplasia with lower urinary tract symptoms     BPH with elevated PSA    Past Surgical History:  Procedure Laterality Date   HERNIA REPAIR     Family History  Problem Relation Age of Onset   Kidney disease Mother    COPD Father    Cancer Brother 71       colon cancer   Social History   Socioeconomic History   Marital status: Widowed    Spouse name: n/a   Number of children: 2   Years of education: 15   Highest education level: Not on file  Occupational History   Occupation: Chemical engineer: [employer]  Tobacco Use   Smoking status: Never    Passive exposure: Never   Smokeless tobacco: Never  Vaping Use   Vaping Use: Never used  Substance and Sexual Activity   Alcohol use: No   Drug use: No   Sexual activity: Yes    Partners: Female    Birth control/protection: Condom    Comment: "not that much"  Other Topics Concern   Not on file  Social History Narrative   Lives alone. Widowed. Wife died in 83 of metastatic breast cancer at age 56 years. Education: college. Pt. Does exercise. One child in [place], [place]. One child in [place].   Social Determinants of Health   Financial Resource Strain: Not on file  Food Insecurity: Not on file  Transportation Needs: Not on file  Physical Activity: Not on file  Stress: Not on file  Social Connections: Not on file    Tobacco Counseling Patient reports he does not smoke currently and none in the past.   Clinical Intake:  Pre-visit preparation completed: Yes  Pain : 0-10 Pain Score: 0-No pain  Diabetes: No  How often do you need to have someone help you when you read instructions, pamphlets, or other written materials from your doctor or pharmacy?: 1 - Never  Diabetic?No   Interpreter Needed?: No   Activities of Daily Living    11/12/2022    3:22 PM  In your present state of health, do you have any difficulty performing the following activities:  Hearing? 0  Vision? 0  Difficulty concentrating or making decisions? 0  Walking or climbing  stairs? 0  Dressing or bathing? 0  Doing errands, shopping? 0  Preparing Food  and eating ? N  Using the Toilet? N  In the past six months, have you accidently leaked urine? N  Do you have problems with loss of bowel control? N  Managing your Medications? N  Managing your Finances? N  Housekeeping or managing your Housekeeping? N    Patient Care Team: Pcp, No as PCP - General Juanita Craver, MD as Consulting Physician (Gastroenterology) Moul, Carmin Richmond, MD as Referring Physician (Urology)  Indicate any recent Medical Services you may have received from other than [facility] providers in the past year (date may be approximate).    Assessment:  This is a routine wellness examination for [facility] [employer].  Hearing/Vision screen Patient reports he has a hearing and eye doctor.  Dietary issues and exercise activities discussed: Current Exercise Habits: The patient does not participate in regular exercise at present, Exercise limited by: None identified  Goals Addressed: Reports he would like to maintain his health.  Depression Screen    11/12/2022    3:22 PM 05/09/2018    9:22 AM 05/06/2018    8:13 AM 06/15/2017    8:32 AM 07/28/2016    8:16 AM 03/19/2016    6:11 PM 02/02/2016   10:46 AM  PHQ 2/9 Scores  PHQ - 2 Score 0 0 0 2 0 0 0  PHQ- 9 Score 0   6       Fall Risk    11/12/2022    3:22 PM 03/28/2019    2:23 PM 05/09/2018    9:22 AM 05/06/2018    8:13 AM 06/15/2017    8:32 AM  Fall Risk   Falls in the past year? 0 0 No Yes No  Comment    05/06/2018   Number falls in past yr: 0 0  1   Comment    left leg   Injury with Fall? 0 0  Yes   Risk for fall due to : No Fall Risks      Follow up Falls evaluation completed        FALL RISK PREVENTION PERTAINING TO THE HOME: Any stairs in or around the home? No  If so, are there any without handrails? No  Home free of loose throw rugs in walkways, pet beds, electrical cords, etc? Yes  Adequate lighting in your home to reduce risk of falls? Yes    ASSISTIVE DEVICES UTILIZED TO PREVENT FALLS: Life alert? No  Use of a cane, walker or w/c? No  Grab bars in the bathroom? No  Shower chair or bench in shower? No  Elevated toilet seat or a handicapped toilet? No   TIMED UP AND GO:  Was the test performed? Yes .  Length of time to ambulate 10 feet: 8 sec.   Gait steady and fast without use of assistive device  Cognitive Function:    11/12/2022    3:22 PM  MMSE - Mini Mental State Exam  Orientation to time 5  Orientation to Place 5  Registration 3  Attention/ Calculation 5  Recall 3  Language- name 2 objects 2  Language- repeat 1  Language- follow 3 step command 3  Language- read & follow direction 1  Write a sentence 1  Copy design 1  Total score 30      11/12/2022    3:23 PM  6CIT Screen  What Year? 0 points  What month? 0 points  What time? 0 points  Count back from 20 0 points  Months in reverse 0 points  Repeat phrase  0 points  Total Score 0 points   Immunizations Immunization History  Administered Date(s) Administered   PFIZER(Purple Top)SARS-COV-2 Vaccination 10/20/2019, 11/10/2019   Pneumococcal Polysaccharide-23 07/17/2013   Tdap 07/17/2014    TDAP status: Up to date  Flu Vaccine status: Declined, Education has been provided regarding the importance of this vaccine but patient still declined. Advised may receive this vaccine at local pharmacy or Health Dept. Aware to provide a copy of the vaccination record if obtained from local pharmacy or Health Dept. Verbalized acceptance and understanding.  Pneumococcal vaccine status: Up to date  Covid-19 vaccine status: Completed vaccines  Qualifies for Shingles Vaccine? Yes   Zostavax completed No   Shingrix Completed?: No.    Education has been provided regarding the importance of this vaccine. Patient has been advised to call insurance company to determine out of pocket expense if they have not yet received this vaccine. Advised may also receive vaccine  at local pharmacy or Health Dept. Verbalized acceptance and understanding.  Screening Tests Health Maintenance  Topic Date Due   COVID-19 Vaccine (3 - 2023-24 season) 05/28/2022   INFLUENZA VACCINE  12/26/2022 (Originally 04/27/2022)   Zoster Vaccines- Shingrix (1 of 2) 02/10/2023 (Originally 01/17/1994)   Pneumonia Vaccine 103+ Years old (2 of 2 - PCV) 11/13/2023 (Originally 07/17/2014)   Medicare Annual Wellness (AWV)  11/13/2023   DTaP/Tdap/Td (2 - Td or Tdap) 07/17/2024   Hepatitis C Screening  Completed   HPV VACCINES  Aged Out   COLONOSCOPY (Pts 45-21yr Insurance coverage will need to be confirmed)  Discontinued    Health Maintenance  Health Maintenance Due  Topic Date Due   COVID-19 Vaccine (3 - 2023-24 season) 05/28/2022    Colorectal cancer screening: Type of screening: Colonoscopy. Completed 12/21/20218.   Lung Cancer Screening: (Low Dose CT Chest recommended if Age 79-80years, 30 pack-year currently smoking OR have quit w/in 15years.) does not qualify.   Lung Cancer Screening Referral: N/A  Additional Screening:  Hepatitis C Screening: does qualify; Completed 07/17/2013  Vision Screening: Recommended annual ophthalmology exams for early detection of glaucoma and other disorders of the eye. Is the patient up to date with their annual eye exam?  Yes  Who is the provider or what is the name of the office in which the patient attends annual eye exams? [facility]  If pt is not established with a provider, would they like to be referred to a provider to establish care? No .   Dental Screening: Recommended annual dental exams for proper oral hygiene  Community Resource Referral / Chronic Care Management: CRR required this visit?  No   CCM required this visit?  No    Plan:  1. Medicare annual wellness visit, subsequent - Counseled on 150 minutes of exercise per week as tolerated, healthy eating (including decreased daily intake of saturated fats,  cholesterol, added sugars, sodium), STI prevention, and routine healthcare maintenance.  2. Screening for metabolic disorder - Routine screening.  - CMP14+EGFR  3. Screening for deficiency anemia - Routine screening.  - CBC  4. Diabetes mellitus screening - Routine screening.  - Hemoglobin A1c  5. Screening cholesterol level - Routine screening.  - Lipid panel  6. Thyroid disorder screen - Routine screening.  - TSH   I have personally reviewed and noted the following in the patient's chart:   Medical and social history Use of alcohol, tobacco or illicit drugs  Current medications and supplements including opioid prescriptions. Patient is  not currently taking opioid prescriptions. Functional ability and status Nutritional status Physical activity Advanced directives List of other physicians Hospitalizations, surgeries, and ER visits in previous 12 months Vitals Screenings to include cognitive, depression, and falls Referrals and appointments  In addition, I have reviewed and discussed with patient certain preventive protocols, quality metrics, and best practice recommendations. A written personalized care plan for preventive services as well as general preventive health recommendations were provided to patient.     Camillia Herter, NP   11/12/2022

## 2022-11-13 LAB — LIPID PANEL
Chol/HDL Ratio: 2.9 ratio (ref 0.0–5.0)
Cholesterol, Total: 149 mg/dL (ref 100–199)
HDL: 51 mg/dL (ref >39)
LDL Chol Calc (NIH): 78 mg/dL (ref 0–99)
Triglycerides: 111 mg/dL (ref 0–149)
VLDL Cholesterol Cal: 20 mg/dL (ref 5–40)

## 2022-11-13 LAB — CBC
Hematocrit: 39 % (ref 37.5–51.0)
Hemoglobin: 13.4 g/dL (ref 13.0–17.7)
MCH: 32.3 pg (ref 26.6–33.0)
MCHC: 34.4 g/dL (ref 31.5–35.7)
MCV: 94 fL (ref 79–97)
Platelets: 186 10*3/uL (ref 150–450)
RBC: 4.15 x10E6/uL (ref 4.14–5.80)
RDW: 11.8 % (ref 11.6–15.4)
WBC: 5.6 10*3/uL (ref 3.4–10.8)

## 2022-11-13 LAB — CMP14+EGFR
ALT: 22 IU/L (ref 0–44)
AST: 21 IU/L (ref 0–40)
Albumin/Globulin Ratio: 2.2 (ref 1.2–2.2)
Albumin: 4 g/dL (ref 3.8–4.8)
Alkaline Phosphatase: 61 IU/L (ref 44–121)
BUN/Creatinine Ratio: 13 (ref 10–24)
BUN: 16 mg/dL (ref 8–27)
Bilirubin Total: 0.4 mg/dL (ref 0.0–1.2)
CO2: 21 mmol/L (ref 20–29)
Calcium: 8.8 mg/dL (ref 8.6–10.2)
Chloride: 105 mmol/L (ref 96–106)
Creatinine, Ser: 1.21 mg/dL (ref 0.76–1.27)
Globulin, Total: 1.8 g/dL (ref 1.5–4.5)
Glucose: 107 mg/dL — ABNORMAL HIGH (ref 70–99)
Potassium: 4.3 mmol/L (ref 3.5–5.2)
Sodium: 141 mmol/L (ref 134–144)
Total Protein: 5.8 g/dL — ABNORMAL LOW (ref 6.0–8.5)
eGFR: 61 mL/min/{1.73_m2} (ref >59)

## 2022-11-13 LAB — HEMOGLOBIN A1C
Est. average glucose Bld gHb Est-mCnc: 126 mg/dL
Hgb A1c MFr Bld: 6 % — ABNORMAL HIGH (ref 4.8–5.6)

## 2022-11-13 LAB — TSH: TSH: 3.93 u[IU]/mL (ref 0.450–4.500)

## 2022-11-15 ENCOUNTER — Encounter: Payer: Self-pay | Admitting: Family

## 2022-12-11 IMAGING — CR DG CHEST 2V
2 series · 2 of 2 positions shown · non-contrast
Comparison: Radiographs 02/02/2016 and 10/12/2013.

CLINICAL DATA: Productive cough with shortness of breath and chest
pain.

EXAM:
CHEST - 2 VIEW

[chest pa]
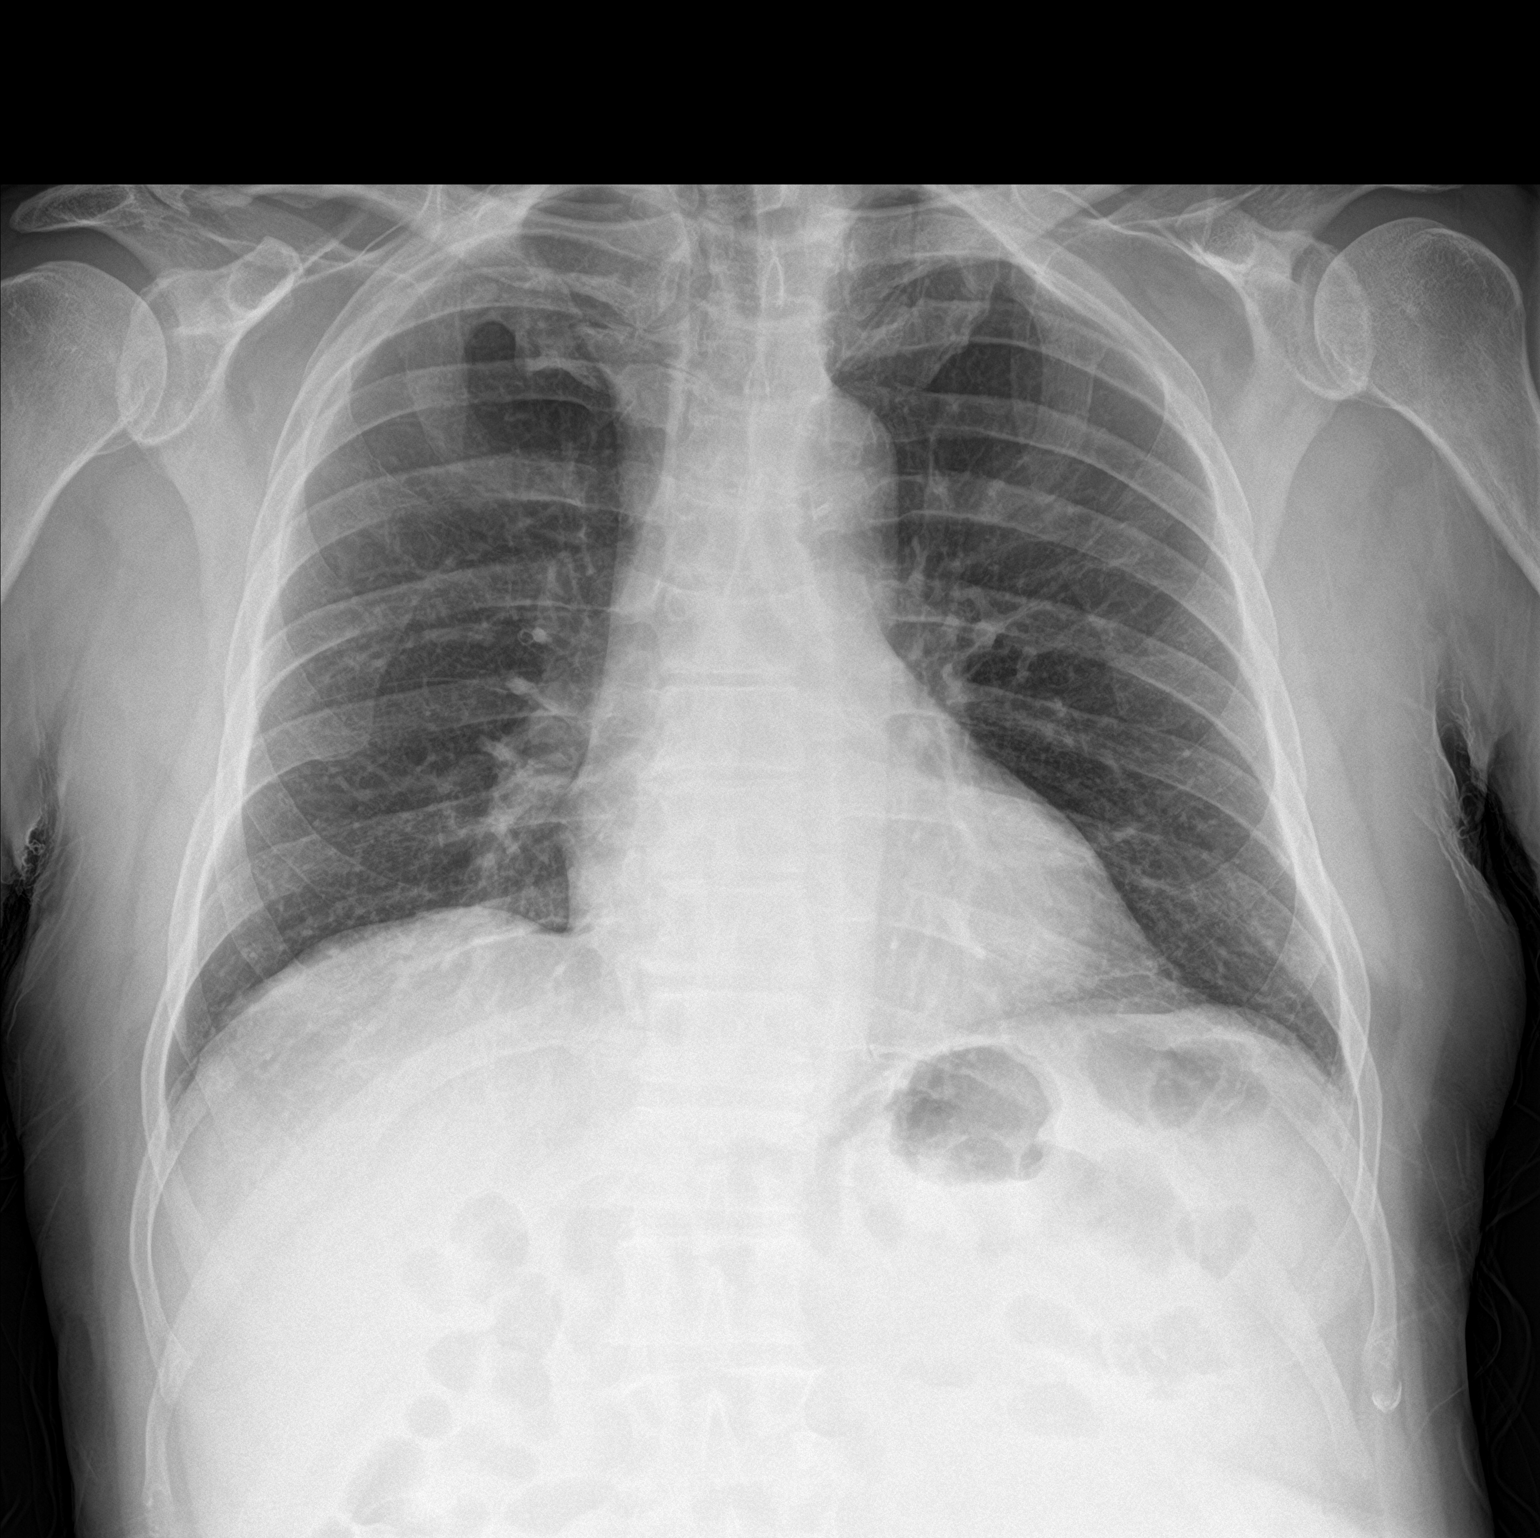

[chest lat]
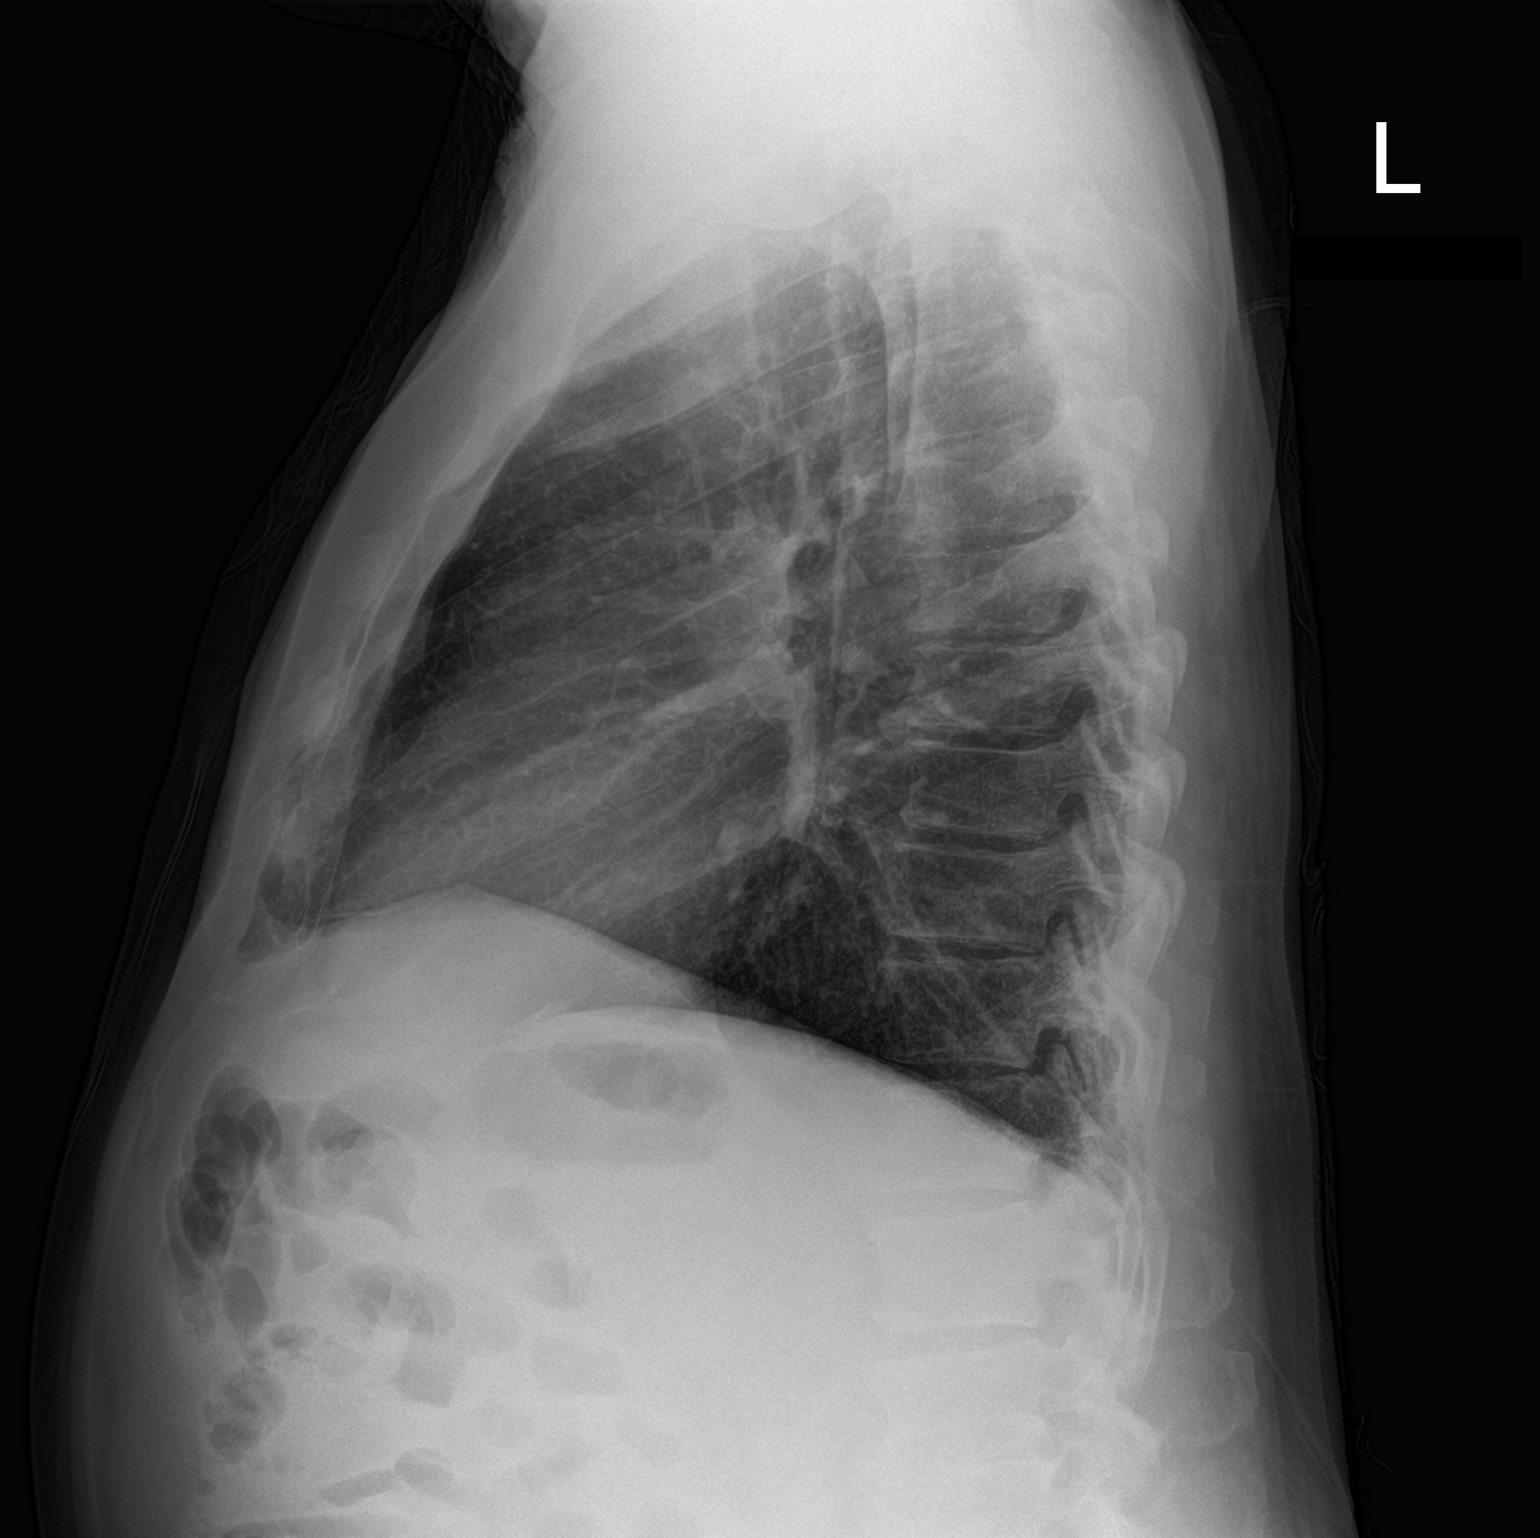

[2 of 2 positions shown; findings below may reference images not displayed]

FINDINGS: The heart size and mediastinal contours are normal. The lungs are
clear. There is no pleural effusion or pneumothorax. No acute
osseous findings are identified. Stable upper rib deformities
bilaterally.
IMPRESSION: Stable chest.  No active cardiopulmonary process.

## 2023-02-15 ENCOUNTER — Ambulatory Visit
Admission: EM | Admit: 2023-02-15 | Discharge: 2023-02-15 | Disposition: A | Discharge status: Home / Self Care | Payer: Medicare HMO | Attending: Internal Medicine | Admitting: Internal Medicine

## 2023-02-15 DIAGNOSIS — L03114 Cellulitis of left upper limb: Secondary | ICD-10-CM | POA: Diagnosis not present

## 2023-02-15 MED ORDER — AMOXICILLIN-POT CLAVULANATE 875-125 MG PO TABS: 1.0000 | ORAL_TABLET | Freq: Two times a day (BID) | ORAL | 0 refills | Status: AC

## 2023-02-15 MED ORDER — CEPHALEXIN 500 MG PO CAPS: 500.0000 mg | ORAL_CAPSULE | Freq: Three times a day (TID) | ORAL | 0 refills | Status: DC

## 2023-02-15 MED ORDER — AMOXICILLIN-POT CLAVULANATE 875-125 MG PO TABS: 1.0000 | ORAL_TABLET | Freq: Two times a day (BID) | ORAL | 0 refills | Status: DC

## 2023-02-15 NOTE — ED Provider Notes (Signed)
[facility]    CSN: [id number] Arrival date & time: 02/15/23  1805      History   Chief Complaint Chief Complaint  Patient presents with   Hand Pain   Tachycardia    HPI Andrew Hurst is a 77 y.o. male.   Patient presents to urgent care for evaluation of pain to the radial aspect of the left hand/wrist that started suddenly this morning. He first noticed this when he woke up. Denies recent trauma/injury to the left hand. He noticed an area of redness associated with the pain that he describes as warm and tender to touch. He wonders if he may have suffered a bug bite to the area but does not remember anything biting him and states the area is non-draining. No obvious bite wounds. No history of malignancy, gout, immunosuppression, or recent long periods of not using the left arm/hand. He is not a diabetic and denies recent antibiotic/steroid use. No sick contacts with similar rash/symptoms. No environmental allergies to his knowledge, exposure to new plants, or recent changes in medications. Of note, he states he began to notice heart racing sensation this morning when he noticed the rash/pain to the left wrist but is unsure if this is due to anxiety related to wrist pain or his heart. Denies associated chest pain, shortness of breath, dizziness, headache, or vision changes. Heart racing sensation resolved on its own without intervention shortly after it started this morning and he is not experiencing this currently.      Past Medical History:  Diagnosis Date   Benign non-nodular prostatic hyperplasia with lower urinary tract symptoms    BPH with elevated PSA     Patient Active Problem List   Diagnosis Date Noted   Inflammatory disorder of digestive system 11/12/2022   Colon cancer screening 11/12/2022   Screening for malignant neoplasm of colon 11/12/2022   Family history of colon cancer 11/12/2022   Family history of malignant neoplasm of digestive organ  11/12/2022   Thrombosed external hemorrhoids 11/12/2022   Heart murmur 04/03/2019   Dyspnea on exertion 04/03/2019   Prediabetes 04/03/2019   History of colonic polyps 09/23/2017   Benign prostatic hyperplasia 07/17/2014   Hearing loss of left ear 07/17/2014   Hyperglycemia 07/17/2014   Elevated PSA, less than 10 ng/ml 10/29/2013    Past Surgical History:  Procedure Laterality Date   HERNIA REPAIR         Home Medications    Prior to Admission medications   Medication Sig Start Date End Date Taking? Authorizing Provider  tamsulosin (FLOMAX) 0.4 MG CAPS capsule Take 0.4 mg by mouth. 3 times a week   Yes [provider]  amoxicillin-clavulanate (AUGMENTIN) 875-125 MG tablet Take 1 tablet by mouth every 12 (twelve) hours. 02/15/23   Carlisle Beers, FNP    Family History Family History  Problem Relation Age of Onset   Kidney disease Mother    COPD Father    Cancer Brother 10       colon cancer    Social History Social History   Tobacco Use   Smoking status: Never    Passive exposure: Never   Smokeless tobacco: Never  Vaping Use   Vaping Use: Never used  Substance Use Topics   Alcohol use: No   Drug use: No     Allergies   Patient has no known allergies.   Review of Systems Review of Systems Per HPI  Physical Exam Triage Vital Signs  ED Triage Vitals  Enc Vitals Group     BP 02/15/23 1910 134/65     Pulse Rate 02/15/23 1910 63     Resp 02/15/23 1910 18     Temp 02/15/23 1910 (!) 97.3 F (36.3 C)     Temp Source 02/15/23 1910 Oral     SpO2 02/15/23 1910 95 %     Weight 02/15/23 1909 173 lb (78.5 kg)     Height 02/15/23 1909 5\' 7"  (1.702 m)     Head Circumference --      Peak Flow --      Pain Score 02/15/23 1909 10     Pain Loc --      Pain Edu? --      Excl. in GC? --    No data found.  Updated Vital Signs BP 134/65 (BP Location: Right Arm)   Pulse 62   Temp (!) 97.3 F (36.3 C) (Oral)   Resp 18   Ht 5\' 7"  (1.702 m)    Wt 173 lb (78.5 kg)   SpO2 95%   BMI 27.10 kg/m   Visual Acuity Right Eye Distance:   Left Eye Distance:   Bilateral Distance:    Right Eye Near:   Left Eye Near:    Bilateral Near:     Physical Exam Vitals and nursing note reviewed.  Constitutional:      Appearance: He is not ill-appearing or toxic-appearing.  HENT:     Head: Normocephalic and atraumatic.     Right Ear: Hearing and external ear normal.     Left Ear: Hearing and external ear normal.     Nose: Nose normal.     Mouth/Throat:     Lips: Pink.  Eyes:     General: Lids are normal. Vision grossly intact. Gaze aligned appropriately.     Extraocular Movements: Extraocular movements intact.     Conjunctiva/sclera: Conjunctivae normal.  Pulmonary:     Effort: Pulmonary effort is normal.  Musculoskeletal:     Left wrist: Swelling and tenderness (TTP to the area indicated in graphic) present. No deformity, effusion, lacerations or crepitus. Normal range of motion. Normal pulse (+2 left radial pulse).     Right hand: Normal.     Left hand: Normal.       Hands:     Cervical back: Neck supple.     Comments: Subtle erythema, warmth, and soft tissue swelling to the overlying skin of the dorsal aspect of the left hand at area of greatest tenderness.   Skin:    General: Skin is warm and dry.     Capillary Refill: Capillary refill takes less than 2 seconds.     Findings: Rash present.  Neurological:     General: No focal deficit present.     Mental Status: He is alert and oriented to person, place, and time. Mental status is at baseline.     Cranial Nerves: No dysarthria or facial asymmetry.  Psychiatric:        Mood and Affect: Mood normal.        Speech: Speech normal.        Behavior: Behavior normal.        Thought Content: Thought content normal.        Judgment: Judgment normal.         UC Treatments / Results  Labs (all labs ordered are listed, but only abnormal results are displayed) Labs Reviewed -  No data to display  EKG  Radiology No results found.  Procedures Procedures (including critical care time)  Medications Ordered in UC Medications - No data to display  Initial Impression / Assessment and Plan / UC Course  I have reviewed the triage vital signs and the nursing notes.  Pertinent labs & imaging results that were available during my care of the patient were reviewed by me and considered in my medical decision making (see chart for details).   1. Cellulitis of left hand Unclear etiology of symptoms, however I am able to appreciate a subtle area of erythema and warmth to the area of greatest tenderness not easily seen in the photos above making this a likely cellulitis. Low suspicion for fracture/dislocation given atraumatic mechanism of injury. Low suspicion for clotting etiology as patient does not have risk factors for this. Low suspicion for gout due to same reason (low risk factors). Will manage this as a cellulitis as stated above with keflex TID for 7 days. Strict ER and urgent care return precautions discussed. I do not feel imaging would provide much useful information or change treatment plan based on atraumatic mechanism of injury, therefore deferred. Patient is agreeable with plan.   Discussed physical exam and available lab work findings in clinic with patient.  Counseled patient regarding appropriate use of medications and potential side effects for all medications recommended or prescribed today. Discussed red flag signs and symptoms of worsening condition,when to call the PCP office, return to urgent care, and when to seek higher level of care in the emergency department. Patient verbalizes understanding and agreement with plan. All questions answered. Patient discharged in stable condition.    Final Clinical Impressions(s) / UC Diagnoses   Final diagnoses:  Cellulitis of left hand     Discharge Instructions      Augmentin antibiotic twice a day for the  next 7 days to treat suspected infection to the left wrist.  If the area of swelling and redness spreads past the line on your hand or if you develop a fever, chills, body aches, nausea, vomiting, etc, please return to urgent care for this to be reevaluated.   If you develop any new or worsening symptoms or do not improve in the next 2 to 3 days, please return.  If your symptoms are severe, please go to the emergency room.  Follow-up with your primary care provider for further evaluation and management of your symptoms as well as ongoing wellness visits.  I hope you feel better!     ED Prescriptions     Medication Sig Dispense Auth. Provider   cephALEXin (KEFLEX) 500 MG capsule  (Status: Discontinued) Take 1 capsule (500 mg total) by mouth 3 (three) times daily for 7 days. 21 capsule Reita May M, FNP   amoxicillin-clavulanate (AUGMENTIN) 875-125 MG tablet  (Status: Discontinued) Take 1 tablet by mouth every 12 (twelve) hours. 14 tablet Carlisle Beers, FNP   amoxicillin-clavulanate (AUGMENTIN) 875-125 MG tablet Take 1 tablet by mouth every 12 (twelve) hours. 14 tablet Carlisle Beers, FNP      PDMP not reviewed this encounter.   Carlisle Beers, [place] 02/17/23 2146

## 2023-02-15 NOTE — ED Triage Notes (Signed)
Here for Left hand pain "just below left thumb". No injury. No bite. No sting. Nothing known. Redness/swelling. "I have felt like my heart is racing today" as well. No chest pain. No sob.

## 2023-02-15 NOTE — Discharge Instructions (Addendum)
Augmentin antibiotic twice a day for the next 7 days to treat suspected infection to the left wrist.  If the area of swelling and redness spreads past the line on your hand or if you develop a fever, chills, body aches, nausea, vomiting, etc, please return to urgent care for this to be reevaluated.   If you develop any new or worsening symptoms or do not improve in the next 2 to 3 days, please return.  If your symptoms are severe, please go to the emergency room.  Follow-up with your primary care provider for further evaluation and management of your symptoms as well as ongoing wellness visits.  I hope you feel better!

## 2023-04-01 ENCOUNTER — Ambulatory Visit
Admission: RE | Admit: 2023-04-01 | Discharge: 2023-04-01 | Disposition: A | Discharge status: Home / Self Care | Payer: Medicare HMO | Source: Ambulatory Visit

## 2023-04-01 VITALS — BP 136/73 | HR 63 | Temp 97.9°F | Resp 18 | Ht 67.0 in | Wt 173.1 lb

## 2023-04-01 DIAGNOSIS — H6123 Impacted cerumen, bilateral: Secondary | ICD-10-CM

## 2023-04-01 NOTE — ED Triage Notes (Signed)
Here for "Left ear problem, build up of wax noticed in ear by audiologist when having recent hearing test".

## 2023-04-01 NOTE — ED Provider Notes (Signed)
[facility]    CSN: [id number] Arrival date & time: 04/01/23  0854      History   Chief Complaint Chief Complaint  Patient presents with   Ear Problem    HPI Andrew Hurst is a 77 y.o. male.   Patient complains of a buildup of earwax.  The history is provided by the patient. No language interpreter was used.    Past Medical History:  Diagnosis Date   Benign non-nodular prostatic hyperplasia with lower urinary tract symptoms    BPH with elevated PSA     Patient Active Problem List   Diagnosis Date Noted   Inflammatory disorder of digestive system 11/12/2022   Colon cancer screening 11/12/2022   Screening for malignant neoplasm of colon 11/12/2022   Family history of colon cancer 11/12/2022   Family history of malignant neoplasm of digestive organ 11/12/2022   Thrombosed external hemorrhoids 11/12/2022   Heart murmur 04/03/2019   Dyspnea on exertion 04/03/2019   Prediabetes 04/03/2019   History of colonic polyps 09/23/2017   Benign prostatic hyperplasia 07/17/2014   Hearing loss of left ear 07/17/2014   Hyperglycemia 07/17/2014   Elevated PSA, less than 10 ng/ml 10/29/2013    Past Surgical History:  Procedure Laterality Date   HERNIA REPAIR         Home Medications    Prior to Admission medications   Medication Sig Start Date End Date Taking? Authorizing Provider  tamsulosin (FLOMAX) 0.4 MG CAPS capsule Take 0.4 mg by mouth. 3 times a week   Yes [provider]  amoxicillin-clavulanate (AUGMENTIN) 875-125 MG tablet Take 1 tablet by mouth every 12 (twelve) hours. 02/15/23   Carlisle Beers, FNP    Family History Family History  Problem Relation Age of Onset   Kidney disease Mother    COPD Father    Cancer Brother 83       colon cancer    Social History Social History   Tobacco Use   Smoking status: Never    Passive exposure: Never   Smokeless tobacco: Never  Vaping Use   Vaping Use: Never used  Substance Use  Topics   Alcohol use: No   Drug use: No     Allergies   Patient has no known allergies.   Review of Systems Review of Systems  All other systems reviewed and are negative.    Physical Exam Triage Vital Signs ED Triage Vitals  Enc Vitals Group     BP 04/01/23 0934 (!) 145/74     Pulse Rate 04/01/23 0934 63     Resp 04/01/23 0934 18     Temp 04/01/23 0934 97.9 F (36.6 C)     Temp Source 04/01/23 0934 Oral     SpO2 04/01/23 0934 98 %     Weight 04/01/23 0933 173 lb 1 oz (78.5 kg)     Height 04/01/23 0933 5\' 7"  (1.702 m)     Head Circumference --      Peak Flow --      Pain Score 04/01/23 0933 0     Pain Loc --      Pain Edu? --      Excl. in GC? --    No data found.  Updated Vital Signs BP 136/73 (BP Location: Left Arm)   Pulse 63   Temp 97.9 F (36.6 C) (Oral)   Resp 18   Ht 5\' 7"  (1.702 m)   Wt 78.5 kg   SpO2 98%  BMI 27.11 kg/m   Visual Acuity Right Eye Distance:   Left Eye Distance:   Bilateral Distance:    Right Eye Near:   Left Eye Near:    Bilateral Near:     Physical Exam Vitals and nursing note reviewed.  Constitutional:      Appearance: He is well-developed.  HENT:     Head: Normocephalic.     Left Ear: There is impacted cerumen.  Cardiovascular:     Rate and Rhythm: Normal rate.  Pulmonary:     Effort: Pulmonary effort is normal.  Abdominal:     General: There is no distension.  Musculoskeletal:        General: Normal range of motion.     Cervical back: Normal range of motion.  Skin:    General: Skin is warm.  Neurological:     General: No focal deficit present.     Mental Status: He is alert and oriented to person, place, and time.      UC Treatments / Results  Labs (all labs ordered are listed, but only abnormal results are displayed) Labs Reviewed - No data to display  EKG   Radiology No results found.  Procedures Procedures (including critical care time)  Medications Ordered in UC Medications - No data to  display  Initial Impression / Assessment and Plan / UC Course  I have reviewed the triage vital signs and the nursing notes.  Pertinent labs & imaging results that were available during my care of the patient were reviewed by me and considered in my medical decision making (see chart for details).     Ear irrigated by RN patient reexamined bilateral TMs are visible ear canals are clear Final Clinical Impressions(s) / UC Diagnoses   Final diagnoses:  Bilateral impacted cerumen   Discharge Instructions   None    ED Prescriptions   None    PDMP not reviewed this encounter. An After Visit Summary was printed and given to the patient.       Elson Areas, [place] 04/01/23 [phone]

## 2023-06-09 ENCOUNTER — Encounter (HOSPITAL_BASED_OUTPATIENT_CLINIC_OR_DEPARTMENT_OTHER): Payer: Self-pay | Admitting: Emergency Medicine

## 2023-06-09 ENCOUNTER — Emergency Department (HOSPITAL_BASED_OUTPATIENT_CLINIC_OR_DEPARTMENT_OTHER)
Admission: EM | Admit: 2023-06-09 | Discharge: 2023-06-10 | Disposition: A | Discharge status: Home / Self Care | Payer: Medicare HMO | Attending: Emergency Medicine | Admitting: Emergency Medicine

## 2023-06-09 ENCOUNTER — Other Ambulatory Visit: Payer: Self-pay

## 2023-06-09 DIAGNOSIS — M546 Pain in thoracic spine: Secondary | ICD-10-CM | POA: Diagnosis not present

## 2023-06-09 DIAGNOSIS — R109 Unspecified abdominal pain: Secondary | ICD-10-CM | POA: Insufficient documentation

## 2023-06-09 DIAGNOSIS — R9431 Abnormal electrocardiogram [ECG] [EKG]: Secondary | ICD-10-CM | POA: Diagnosis not present

## 2023-06-09 DIAGNOSIS — M545 Low back pain, unspecified: Secondary | ICD-10-CM | POA: Insufficient documentation

## 2023-06-09 LAB — URINALYSIS, ROUTINE W REFLEX MICROSCOPIC
Bilirubin Urine: NEGATIVE
Glucose, UA: NEGATIVE mg/dL
Hgb urine dipstick: NEGATIVE
Ketones, ur: NEGATIVE mg/dL
Leukocytes,Ua: NEGATIVE
Nitrite: NEGATIVE
Protein, ur: NEGATIVE mg/dL
Specific Gravity, Urine: 1.022 (ref 1.005–1.030)
pH: 6.5 (ref 5.0–8.0)

## 2023-06-09 NOTE — ED Triage Notes (Signed)
Pt c/o R flank pain. Pt reports began yesterday but has worsened tonight. Pt reports that he had one episode of diarrhea tonight. Denies any GU s/s.

## 2023-06-10 ENCOUNTER — Emergency Department (HOSPITAL_BASED_OUTPATIENT_CLINIC_OR_DEPARTMENT_OTHER): Payer: Medicare HMO

## 2023-06-10 DIAGNOSIS — N400 Enlarged prostate without lower urinary tract symptoms: Secondary | ICD-10-CM | POA: Diagnosis not present

## 2023-06-10 DIAGNOSIS — K7689 Other specified diseases of liver: Secondary | ICD-10-CM | POA: Diagnosis not present

## 2023-06-10 DIAGNOSIS — N281 Cyst of kidney, acquired: Secondary | ICD-10-CM | POA: Diagnosis not present

## 2023-06-10 DIAGNOSIS — K5730 Diverticulosis of large intestine without perforation or abscess without bleeding: Secondary | ICD-10-CM | POA: Diagnosis not present

## 2023-06-10 LAB — CBC WITH DIFFERENTIAL/PLATELET
Abs Immature Granulocytes: 0.02 10*3/uL (ref 0.00–0.07)
Basophils Absolute: 0 10*3/uL (ref 0.0–0.1)
Basophils Relative: 0 %
Eosinophils Absolute: 0.2 10*3/uL (ref 0.0–0.5)
Eosinophils Relative: 2 %
HCT: 36.9 % — ABNORMAL LOW (ref 39.0–52.0)
Hemoglobin: 12.5 g/dL — ABNORMAL LOW (ref 13.0–17.0)
Immature Granulocytes: 0 %
Lymphocytes Relative: 8 %
Lymphs Abs: 0.8 10*3/uL (ref 0.7–4.0)
MCH: 31.5 pg (ref 26.0–34.0)
MCHC: 33.9 g/dL (ref 30.0–36.0)
MCV: 92.9 fL (ref 80.0–100.0)
Monocytes Absolute: 0.8 10*3/uL (ref 0.1–1.0)
Monocytes Relative: 8 %
Neutro Abs: 8 10*3/uL — ABNORMAL HIGH (ref 1.7–7.7)
Neutrophils Relative %: 82 %
Platelets: 166 10*3/uL (ref 150–400)
RBC: 3.97 MIL/uL — ABNORMAL LOW (ref 4.22–5.81)
RDW: 12.4 % (ref 11.5–15.5)
WBC: 9.9 10*3/uL (ref 4.0–10.5)
nRBC: 0 % (ref 0.0–0.2)

## 2023-06-10 LAB — COMPREHENSIVE METABOLIC PANEL
ALT: 14 U/L (ref 0–44)
AST: 16 U/L (ref 15–41)
Albumin: 3.6 g/dL (ref 3.5–5.0)
Alkaline Phosphatase: 53 U/L (ref 38–126)
Anion gap: 7 (ref 5–15)
BUN: 27 mg/dL — ABNORMAL HIGH (ref 8–23)
CO2: 26 mmol/L (ref 22–32)
Calcium: 8.4 mg/dL — ABNORMAL LOW (ref 8.9–10.3)
Chloride: 107 mmol/L (ref 98–111)
Creatinine, Ser: 1.16 mg/dL (ref 0.61–1.24)
GFR, Estimated: >60 mL/min (ref >60)
Glucose, Bld: 116 mg/dL — ABNORMAL HIGH (ref 70–99)
Potassium: 3.6 mmol/L (ref 3.5–5.1)
Sodium: 140 mmol/L (ref 135–145)
Total Bilirubin: 0.7 mg/dL (ref 0.3–1.2)
Total Protein: 5.6 g/dL — ABNORMAL LOW (ref 6.5–8.1)

## 2023-06-10 LAB — LIPASE, BLOOD: Lipase: 14 U/L (ref 11–51)

## 2023-06-10 LAB — LACTIC ACID, PLASMA: Lactic Acid, Venous: 0.5 mmol/L (ref 0.5–1.9)

## 2023-06-10 MED ORDER — HYDROMORPHONE HCL 1 MG/ML IJ SOLN
0.5000 mg | INTRAMUSCULAR | Status: DC | PRN
  Administered 2023-06-10 (×2): 0.5 mg via INTRAVENOUS
  Filled 2023-06-10 (×2): qty 1

## 2023-06-10 MED ORDER — ASPIRIN 81 MG PO CHEW: 81.0000 mg | CHEWABLE_TABLET | Freq: Every day | ORAL | 0 refills | Status: AC

## 2023-06-10 MED ORDER — METHOCARBAMOL 500 MG PO TABS: 500.0000 mg | ORAL_TABLET | Freq: Two times a day (BID) | ORAL | 0 refills | Status: DC

## 2023-06-10 MED ORDER — SODIUM CHLORIDE 0.9 % IV SOLN: 1000.0000 mL | INTRAVENOUS | Status: DC

## 2023-06-10 MED ORDER — IOHEXOL 350 MG/ML SOLN
100.0000 mL | Freq: Once | INTRAVENOUS | Status: AC | PRN
  Administered 2023-06-10: 100 mL via INTRAVENOUS

## 2023-06-10 MED ORDER — SODIUM CHLORIDE 0.9 % IV BOLUS (SEPSIS)
1000.0000 mL | Freq: Once | INTRAVENOUS | Status: AC
  Administered 2023-06-10: 1000 mL via INTRAVENOUS

## 2023-06-10 NOTE — ED Provider Notes (Signed)
EMERGENCY DEPARTMENT AT [facility] Provider Note  CSN: [id number] Arrival date & time: 06/09/23 2308  Chief Complaint(s) Flank Pain  HPI Andrew Hurst is a 77 y.o. male with a past medical history listed below who presents to the emergency department with approximately 12 hours of progressively worsening right flank pain.  This began while at work.  He denies any significant heavy lifting during the onset of the pain.  He denies any fall or trauma.  Pain is nonradiating.  Worse with certain movements.  No lower extremity weakness or loss of sensation.  No bladder/bowel incontinence.  No urinary symptoms.  Pain became severe this evening prompting his visit to the emergency department.  The history is provided by the patient.    Past Medical History Past Medical History:  Diagnosis Date   Benign non-nodular prostatic hyperplasia with lower urinary tract symptoms    BPH with elevated PSA    Patient Active Problem List   Diagnosis Date Noted   Inflammatory disorder of digestive system 11/12/2022   Colon cancer screening 11/12/2022   Screening for malignant neoplasm of colon 11/12/2022   Family history of colon cancer 11/12/2022   Family history of malignant neoplasm of digestive organ 11/12/2022   Thrombosed external hemorrhoids 11/12/2022   Heart murmur 04/03/2019   Dyspnea on exertion 04/03/2019   Prediabetes 04/03/2019   History of colonic polyps 09/23/2017   Benign prostatic hyperplasia 07/17/2014   Hearing loss of left ear 07/17/2014   Hyperglycemia 07/17/2014   Elevated PSA, less than 10 ng/ml 10/29/2013   Home Medication(s) Prior to Admission medications   Medication Sig Start Date End Date Taking? Authorizing Provider  aspirin 81 MG chewable tablet Chew 1 tablet (81 mg total) by mouth daily. 06/10/23 07/10/23 Yes Dakiya Puopolo, Amadeo Garnet, MD  methocarbamol (ROBAXIN) 500 MG tablet Take 1-2 tablets (500-1,000 mg total) by mouth 2 (two) times daily.  06/10/23  Yes Dreonna Hussein, Amadeo Garnet, MD  amoxicillin-clavulanate (AUGMENTIN) 875-125 MG tablet Take 1 tablet by mouth every 12 (twelve) hours. 02/15/23   Carlisle Beers, FNP  tamsulosin (FLOMAX) 0.4 MG CAPS capsule Take 0.4 mg by mouth. 3 times a week    [provider]                                                                                                                                    Allergies Patient has no known allergies.  Review of Systems Review of Systems As noted in HPI  Physical Exam Vital Signs  I have reviewed the triage vital signs BP (!) 156/74   Pulse 70   Temp 97.6 F (36.4 C) (Oral)   Resp 17   SpO2 98%   Physical Exam Vitals reviewed.  Constitutional:      General: He is not in acute distress.    Appearance: He is well-developed. He is not diaphoretic.  HENT:  Head: Normocephalic and atraumatic.     Right Ear: External ear normal.     Left Ear: External ear normal.     Nose: Nose normal.     Mouth/Throat:     Mouth: Mucous membranes are moist.  Eyes:     General: No scleral icterus.    Conjunctiva/sclera: Conjunctivae normal.  Neck:     Trachea: Phonation normal.  Cardiovascular:     Rate and Rhythm: Normal rate and regular rhythm.  Pulmonary:     Effort: Pulmonary effort is normal. No respiratory distress.     Breath sounds: No stridor.  Abdominal:     General: There is no distension.     Tenderness: There is no abdominal tenderness. There is right CVA tenderness.  Musculoskeletal:        General: Normal range of motion.     Cervical back: Normal range of motion.     Thoracic back: Tenderness present.     Lumbar back: Tenderness present.       Back:  Neurological:     Mental Status: He is alert and oriented to person, place, and time.     Motor: Motor function is intact.     Coordination: Coordination is intact.     Gait: Gait is intact.  Psychiatric:        Behavior: Behavior normal.     ED Results and  Treatments Labs (all labs ordered are listed, but only abnormal results are displayed) Labs Reviewed  CBC WITH DIFFERENTIAL/PLATELET - Abnormal; Notable for the following components:      Result Value   RBC 3.97 (*)    Hemoglobin 12.5 (*)    HCT 36.9 (*)    Neutro Abs 8.0 (*)    All other components within normal limits  COMPREHENSIVE METABOLIC PANEL - Abnormal; Notable for the following components:   Glucose, Bld 116 (*)    BUN 27 (*)    Calcium 8.4 (*)    Total Protein 5.6 (*)    All other components within normal limits  URINALYSIS, ROUTINE W REFLEX MICROSCOPIC  LIPASE, BLOOD  LACTIC ACID, PLASMA  LACTIC ACID, PLASMA                                                                                                                         EKG  EKG Interpretation Date/Time:  Friday June 10 2023 00:31:10 EDT Ventricular Rate:  72 PR Interval:  156 QRS Duration:  95 QT Interval:  390 QTC Calculation: 427 R Axis:   61  Text Interpretation: Sinus rhythm Confirmed by Drema Pry [phone]) on 06/10/2023 3:40:04 AM       Radiology CT Angio Chest/Abd/Pel for Dissection W and/or Wo Contrast  Result Date: 06/10/2023 CLINICAL DATA:  Right flank pain. EXAM: CT ANGIOGRAPHY CHEST, ABDOMEN AND PELVIS TECHNIQUE: Non-contrast CT of the chest was initially obtained. Multidetector CT imaging through the chest, abdomen and pelvis was performed using the standard protocol during bolus administration of intravenous  contrast. Multiplanar reconstructed images and MIPs were obtained and reviewed to evaluate the vascular anatomy. RADIATION DOSE REDUCTION: This exam was performed according to the departmental dose-optimization program which includes automated exposure control, adjustment of the mA and/or kV according to patient size and/or use of iterative reconstruction technique. CONTRAST:  OMNIPAQUE IOHEXOL 350 MG/ML SOLN COMPARISON:  None Available. FINDINGS: CTA CHEST FINDINGS  Cardiovascular: Preferential opacification of the thoracic aorta. No evidence of thoracic aortic aneurysm or dissection. Normal heart size. No pericardial effusion. There are atherosclerotic calcifications of the aorta. There is no central pulmonary embolism. Mediastinum/Nodes: No enlarged mediastinal, hilar, or axillary lymph nodes. Thyroid gland, trachea, and esophagus demonstrate no significant findings. Lungs/Pleura: Lungs are clear. No pleural effusion or pneumothorax. Musculoskeletal: No chest wall abnormality. No acute or significant osseous findings. Review of the MIP images confirms the above findings. CTA ABDOMEN AND PELVIS FINDINGS VASCULAR Aorta: Normal caliber aorta without aneurysm, dissection, vasculitis or significant stenosis. There is mild calcified and noncalcified atherosclerotic disease throughout aorta. Celiac: Patent without evidence of aneurysm, dissection, vasculitis or significant stenosis. SMA: Patent without evidence of aneurysm, dissection, vasculitis or significant stenosis. Renals: Both renal arteries are patent without evidence of aneurysm, dissection, vasculitis, fibromuscular dysplasia or significant stenosis. IMA: There severe focal stenosis of the origin of the inferior mesenteric artery. The artery is otherwise within normal limits. Inflow: Patent without evidence of aneurysm, dissection, vasculitis or significant stenosis. Veins: No obvious venous abnormality within the limitations of this arterial phase study. Review of the MIP images confirms the above findings. NON-VASCULAR Hepatobiliary: There is a rounded 12 mm hypodensity in the inferior right lobe of the liver favored as a cyst. Otherwise, liver, gallbladder and bile ducts are within normal limits. Is seen. No gallstones, gallbladder wall thickening, or biliary dilatation. Pancreas: Unremarkable. No pancreatic ductal dilatation or surrounding inflammatory changes. Spleen: Normal in size without focal abnormality.  Adrenals/Urinary Tract: There is a cyst in the right kidney measuring 12 mm. Otherwise, the kidneys, adrenal glands, and bladder are within normal limits. Stomach/Bowel: Stomach is within normal limits. Appendix appears normal. No evidence of bowel wall thickening, distention, or inflammatory changes. There is sigmoid colon diverticulosis. Lymphatic: No enlarged lymph nodes are identified. Reproductive: Prostate gland is enlarged. Other: No abdominal wall hernia or abnormality. No abdominopelvic ascites. Musculoskeletal: There are degenerative changes of the spine most significant at L2-L3. Review of the MIP images confirms the above findings. IMPRESSION: 1. No evidence for aortic dissection or aneurysm. 2. Severe focal stenosis of the origin of the inferior mesenteric artery. 3. No acute localizing process in the chest, abdomen or pelvis. 4. Colonic diverticulosis. 5. Prostatomegaly. 6. Small hepatic and renal cysts.  No follow-up imaging recommended. Electronically Signed   By: Darliss Cheney M.D.   On: 06/10/2023 01:57    Medications Ordered in ED Medications  HYDROmorphone (DILAUDID) injection 0.5 mg (0.5 mg Intravenous Given 06/10/23 0301)  sodium chloride 0.9 % bolus 1,000 mL (0 mLs Intravenous Stopped 06/10/23 0259)    Followed by  0.9 %  sodium chloride infusion (1,000 mLs Intravenous Not Given 06/10/23 0304)  iohexol (OMNIPAQUE) 350 MG/ML injection 100 mL (100 mLs Intravenous Contrast Given 06/10/23 0120)   Procedures Procedures  (including critical care time) Medical Decision Making / ED Course   Medical Decision Making Amount and/or Complexity of Data Reviewed Labs: ordered. Decision-making details documented in ED Course. Radiology: ordered and independent interpretation performed. Decision-making details documented in ED Course. ECG/medicine tests: ordered and independent interpretation performed. Decision-making  details documented in ED Course.  Risk Prescription drug  management. Parenteral controlled substances. Decision regarding hospitalization.    Right flank pain  Differential diagnoses considered below:  Patient with tenderness to palpation along the paraspinal musculature.  Possible muscle strain/spasm.  However will also assess for acute aortic disease, renal colic, pyelonephritis.  No abdominal tenderness concerning for intra-abdominal inflammatory/infectious processes.  Patient provided with IV fluids and pain medicine.  CBC without leukocytosis or anemia. CMP without significant electrolyte derangements or renal sufficiency.  No evidence of bili obstruction or pancreatitis. Lactic acid normal. UA without evidence of infection or hematuria.  CTA negative for acute arterial process.  It did note incidentally severe stenosis at the origin of the IMA however there was still flow beyond.  No evidence concerning for mesenteric ischemia and clinically there is low concern for this.  CT is notable for degenerative disc disease mostly L2-L3 region.  This may be contributing to the patient's presentation.  No evidence concerning for cauda equina requiring MRI at this time.  Pain significant relief with IV Dilaudid. Recommend close follow-up with PCP. Also recommend follow-up with neurosurgery for degenerative disc disease as well as vascular surgery for the IMA origin stenosis. ASA 81mg  recommneded.    Final Clinical Impression(s) / ED Diagnoses Final diagnoses:  Right flank pain   The patient appears reasonably screened and/or stabilized for discharge and I doubt any other medical condition or other [facility] requiring further screening, evaluation, or treatment in the ED at this time. I have discussed the findings, Dx and Tx plan with the patient/family who expressed understanding and agree(s) with the plan. Discharge instructions discussed at length. The patient/family was given strict return precautions who verbalized understanding of the  instructions. No further questions at time of discharge.  Disposition: Discharge  Condition: Good  ED Discharge Orders          Ordered    methocarbamol (ROBAXIN) 500 MG tablet  2 times daily        06/10/23 0337    aspirin 81 MG chewable tablet  Daily        06/10/23 3244              Follow Up: Primary care provider  Call  to schedule an appointment for close follow up  Coletta Memos, MD [address] [place] [address] [phone]  Call  to schedule an appointment for close follow up for degenerative disc disease  [name], MD [address] [place] [address] [phone]  Call  to schedule an appointment for close follow up of the stenosis of the inferior mesenteric artery    This chart was dictated using voice recognition software.  Despite best efforts to proofread,  errors can occur which can change the documentation meaning.    Nira Conn, MD 06/10/23 [phone]

## 2023-06-10 NOTE — ED Notes (Signed)
Discharge instructions discussed with pt. Pt verbalized understanding. Pt stable and ambulatory.  °

## 2023-06-15 ENCOUNTER — Telehealth: Payer: Self-pay

## 2023-06-15 NOTE — Telephone Encounter (Signed)
I did let patient know he may need to schedule an appointment to see provider next week I will try and see if we can fit him in somewhere.

## 2023-06-15 NOTE — Telephone Encounter (Signed)
I advise front desk to schedule patient for appointment

## 2023-06-15 NOTE — Telephone Encounter (Signed)
Patient was seen in ER over the past couple days, patient stated medication is not lasting long enough for his pain he wants to know if he can t get something stronger or that would last longer patient stated he is currrently having to take 1 1/in the morning and afternoon and it is not working.

## 2023-06-16 DIAGNOSIS — H903 Sensorineural hearing loss, bilateral: Secondary | ICD-10-CM | POA: Diagnosis not present

## 2023-06-16 DIAGNOSIS — I77810 Thoracic aortic ectasia: Secondary | ICD-10-CM | POA: Diagnosis not present

## 2023-06-16 DIAGNOSIS — I700 Atherosclerosis of aorta: Secondary | ICD-10-CM | POA: Diagnosis not present

## 2023-06-16 DIAGNOSIS — K551 Chronic vascular disorders of intestine: Secondary | ICD-10-CM | POA: Diagnosis not present

## 2023-06-17 DIAGNOSIS — R001 Bradycardia, unspecified: Secondary | ICD-10-CM | POA: Diagnosis not present

## 2023-06-20 ENCOUNTER — Encounter: Payer: Self-pay | Admitting: Family

## 2023-06-20 ENCOUNTER — Ambulatory Visit (INDEPENDENT_AMBULATORY_CARE_PROVIDER_SITE_OTHER): Payer: Medicare HMO | Admitting: Family

## 2023-06-20 VITALS — BP 148/78 | HR 60 | Temp 98.5°F | Ht 66.75 in | Wt 170.8 lb

## 2023-06-20 DIAGNOSIS — R109 Unspecified abdominal pain: Secondary | ICD-10-CM | POA: Diagnosis not present

## 2023-06-20 DIAGNOSIS — M5136 Other intervertebral disc degeneration, lumbar region: Secondary | ICD-10-CM

## 2023-06-20 DIAGNOSIS — K551 Chronic vascular disorders of intestine: Secondary | ICD-10-CM

## 2023-06-20 MED ORDER — CYCLOBENZAPRINE HCL 5 MG PO TABS
5.0000 mg | ORAL_TABLET | Freq: Two times a day (BID) | ORAL | 1 refills | Status: AC | PRN
Start: 2023-06-20 — End: ?

## 2023-06-20 NOTE — Progress Notes (Signed)
Patient ID: Andrew Hurst, male    DOB: 1946/04/08  MRN: [id number]  CC: Emergency Department Follow-Up  Subjective: Andrew Hurst is a 77 y.o. male who presents for Emergency Department follow-up.  His concerns today include:  06/09/2023 - 06/10/2023 [facility] Emergency Department at [facility] per MD note: Medical Decision Making Amount and/or Complexity of Data Reviewed Labs: ordered. Decision-making details documented in ED Course. Radiology: ordered and independent interpretation performed. Decision-making details documented in ED Course. ECG/medicine tests: ordered and independent interpretation performed. Decision-making details documented in ED Course.   Risk Prescription drug management. Parenteral controlled substances. Decision regarding hospitalization.     Right flank pain   Differential diagnoses considered below:   Patient with tenderness to palpation along the paraspinal musculature.  Possible muscle strain/spasm.   However will also assess for acute aortic disease, renal colic, pyelonephritis.  No abdominal tenderness concerning for intra-abdominal inflammatory/infectious processes.   Patient provided with IV fluids and pain medicine.   CBC without leukocytosis or anemia. CMP without significant electrolyte derangements or renal sufficiency.  No evidence of bili obstruction or pancreatitis. Lactic acid normal. UA without evidence of infection or hematuria.   CTA negative for acute arterial process.  It did note incidentally severe stenosis at the origin of the IMA however there was still flow beyond.  No evidence concerning for mesenteric ischemia and clinically there is low concern for this.  CT is notable for degenerative disc disease mostly L2-L3 region.  This may be contributing to the patient's presentation.   No evidence concerning for cauda equina requiring MRI at this time.   Pain significant relief with IV Dilaudid. Recommend close  follow-up with PCP. Also recommend follow-up with neurosurgery for degenerative disc disease as well as vascular surgery for the IMA origin stenosis. ASA 81mg  recommneded.   Final Clinical Impression(s) / ED Diagnoses Final diagnoses:  Right flank pain   Follow-Ups  Call Primary care provider; to schedule an appointment for close follow up Call Coletta Memos, MD (Neurosurgery); to schedule an appointment for close follow up for degenerative disc disease Call [name], MD (Vascular Surgery); to schedule an appointment for close follow up of the stenosis of the inferior mesenteric artery  Today's visit 06/20/2023: Reports lower back pain persisting. States Robaxin not helping with back pain and would like to trial a different medication. Reports he has an appointment with Neurosurgery on next month for back pain. States he needs to return to work before then. Reports he has seen Cardiology for stenosis of the inferior mesenteric artery. No further issues/concerns for discussion today.   Patient Active Problem List   Diagnosis Date Noted   Inflammatory disorder of digestive system 11/12/2022   Colon cancer screening 11/12/2022   Screening for malignant neoplasm of colon 11/12/2022   Family history of colon cancer 11/12/2022   Family history of malignant neoplasm of digestive organ 11/12/2022   Thrombosed external hemorrhoids 11/12/2022   Heart murmur 04/03/2019   Dyspnea on exertion 04/03/2019   Prediabetes 04/03/2019   History of colonic polyps 09/23/2017   Benign prostatic hyperplasia 07/17/2014   Hearing loss of left ear 07/17/2014   Hyperglycemia 07/17/2014   Elevated PSA, less than 10 ng/ml 10/29/2013     Current Outpatient Medications on File Prior to Visit  Medication Sig Dispense Refill   tamsulosin (FLOMAX) 0.4 MG CAPS capsule Take 0.4 mg by mouth. 3 times a week     amoxicillin-clavulanate (AUGMENTIN) 875-125 MG tablet Take 1  tablet by mouth every 12 (twelve) hours.  (Patient not taking: Reported on 06/20/2023) 14 tablet 0   aspirin 81 MG chewable tablet Chew 1 tablet (81 mg total) by mouth daily. (Patient not taking: Reported on 06/20/2023) 30 tablet 0   No current facility-administered medications on file prior to visit.    No Known Allergies  Social History   Socioeconomic History   Marital status: Widowed    Spouse name: n/a   Number of children: 2   Years of education: 15   Highest education level: Not on file  Occupational History   Occupation: Training and development officer: [employer]  Tobacco Use   Smoking status: Never    Passive exposure: Never   Smokeless tobacco: Never  Vaping Use   Vaping status: Never Used  Substance and Sexual Activity   Alcohol use: No   Drug use: No   Sexual activity: Yes    Partners: Female    Birth control/protection: Condom    Comment: "not that much"  Other Topics Concern   Not on file  Social History Narrative   Lives alone. Widowed. Wife died in 67 of metastatic breast cancer at age 26 years. Education: college. Pt. Does exercise. One child in [place], [place]. One child in [place].   Social Determinants of Health   Financial Resource Strain: Not on file  Food Insecurity: Not on file  Transportation Needs: Not on file  Physical Activity: Not on file  Stress: Not on file  Social Connections: Not on file  Intimate Partner Violence: Not on file    Family History  Problem Relation Age of Onset   Kidney disease Mother    COPD Father    Cancer Brother 79       colon cancer    Past Surgical History:  Procedure Laterality Date   HERNIA REPAIR      ROS: Review of Systems Negative except as stated above  PHYSICAL EXAM: BP (!) 148/78   Pulse 60   Temp 98.5 F (36.9 C) (Oral)   Ht 5' 6.75" (1.695 m)   Wt 170 lb 12.8 oz (77.5 kg)   SpO2 97%   BMI 26.95 kg/m    Physical Exam HENT:     Head: Normocephalic and atraumatic.     Nose: Nose normal.     Mouth/Throat:     Mouth: Mucous  membranes are moist.     Pharynx: Oropharynx is clear.  Eyes:     Extraocular Movements: Extraocular movements intact.     Conjunctiva/sclera: Conjunctivae normal.     Pupils: Pupils are equal, round, and reactive to light.  Cardiovascular:     Rate and Rhythm: Normal rate and regular rhythm.     Pulses: Normal pulses.     Heart sounds: Normal heart sounds.  Pulmonary:     Effort: Pulmonary effort is normal.     Breath sounds: Normal breath sounds.  Musculoskeletal:        General: Normal range of motion.     Right shoulder: Normal.     Left shoulder: Normal.     Right upper arm: Normal.     Left upper arm: Normal.     Right elbow: Normal.     Left elbow: Normal.     Right forearm: Normal.     Left forearm: Normal.     Right wrist: Normal.     Left wrist: Normal.     Right hand: Normal.     Left  hand: Normal.     Cervical back: Normal, normal range of motion and neck supple.     Thoracic back: Normal.     Lumbar back: Tenderness present.     Right hip: Normal.     Left hip: Normal.     Right upper leg: Normal.     Left upper leg: Normal.     Right knee: Normal.     Left knee: Normal.     Right lower leg: Normal.     Left lower leg: Normal.     Right ankle: Normal.     Left ankle: Normal.     Right foot: Normal.     Left foot: Normal.  Neurological:     General: No focal deficit present.     Mental Status: He is alert and oriented to person, place, and time.  Psychiatric:        Mood and Affect: Mood normal.        Behavior: Behavior normal.     ASSESSMENT AND PLAN: 1. Degenerative disc disease, lumbar 2. Right flank pain - Methocarbamol discontinued due to ineffective.  - Trial Cyclobenzaprine as prescribed. Counseled on medication adherence/adverse effects.  - Patient declined pain injection today in office. - Keep all scheduled appointments with Neurosurgery. Also, today new referral to Neurosurgery ordered to see if patient can be scheduled for sooner  appointment. - Discussed with patient in detail if he needs paperwork completed for his job until appointment with Neurosurgery to schedule appointment with Primary Care and he verbalized understanding.  - cyclobenzaprine (FLEXERIL) 5 MG tablet; Take 1 tablet (5 mg total) by mouth 2 (two) times daily as needed for muscle spasms.  Dispense: 30 tablet; Refill: 1 - Ambulatory referral to Neurosurgery  3. Stenosis of inferior mesenteric artery (HCC) - Keep all scheduled appointments with Cardiology.    Patient was given the opportunity to ask questions.  Patient verbalized understanding of the plan and was able to repeat key elements of the plan. Patient was given clear instructions to go to Emergency Department or return to medical center if symptoms don't improve, worsen, or new problems develop.The patient verbalized understanding.   Orders Placed This Encounter  Procedures   Ambulatory referral to Neurosurgery     Requested Prescriptions   Signed Prescriptions Disp Refills   cyclobenzaprine (FLEXERIL) 5 MG tablet 30 tablet 1    Sig: Take 1 tablet (5 mg total) by mouth 2 (two) times daily as needed for muscle spasms.    Follow-up with primary provider as scheduled.  Rema Fendt, NP

## 2023-06-20 NOTE — Progress Notes (Signed)
Patient wants to speak about pain medication.   Patient needs a letter to go back to work.  Wants to speak about muscle relaxer's.

## 2023-06-21 ENCOUNTER — Telehealth: Payer: Self-pay | Admitting: Family

## 2023-06-21 NOTE — Telephone Encounter (Signed)
I discussed with patient in detail during office visit on 06/20/2023 Neurosurgery to provide medical clearance to return to work. Please review office visit note for additional details.

## 2023-06-21 NOTE — Telephone Encounter (Signed)
Spoke to patient directly about this.

## 2023-06-21 NOTE — Telephone Encounter (Unsigned)
Copied from CRM [id number]. Topic: General - Other >> Jun 20, 2023  4:55 PM Andrew Hurst wrote: Reason for CRM: The patient's employer has called to request additional information be provided to the patient regarding their return to work as well as the restrictions of their duties when they return   Please contact the patient further when possible to provide additional information

## 2023-07-04 DIAGNOSIS — M5440 Lumbago with sciatica, unspecified side: Secondary | ICD-10-CM | POA: Diagnosis not present

## 2023-07-13 ENCOUNTER — Telehealth: Payer: Self-pay

## 2023-07-13 NOTE — Telephone Encounter (Signed)
Transition Care Management Unsuccessful Follow-up Telephone Call  Date of discharge and from where:  06/10/2023 [facility]  Attempts:  1st Attempt  Reason for unsuccessful TCM follow-up call:  No answer/busy  Kamrie Fanton [facility]  [facility], [facility] Guide Direct Dial: [phone]  Website: [email or web address]

## 2023-07-14 ENCOUNTER — Telehealth: Payer: Self-pay

## 2023-07-14 NOTE — Telephone Encounter (Signed)
Transition Care Management Unsuccessful Follow-up Telephone Call  Date of discharge and from where:  06/10/2023 [facility]  Attempts:  2nd Attempt  Reason for unsuccessful TCM follow-up call:  Left voice message  Fara Worthy [facility]  [facility] Guide Direct Dial: [phone]  Website: [email or web address]

## 2023-11-03 ENCOUNTER — Encounter: Payer: Medicare HMO | Admitting: Family

## 2023-11-14 ENCOUNTER — Encounter: Payer: Medicare HMO | Admitting: Family

## 2023-11-16 ENCOUNTER — Encounter: Payer: Medicare HMO | Admitting: Family

## 2023-12-01 DIAGNOSIS — K080 Exfoliation of teeth due to systemic causes: Secondary | ICD-10-CM | POA: Diagnosis not present

## 2023-12-08 DIAGNOSIS — K080 Exfoliation of teeth due to systemic causes: Secondary | ICD-10-CM | POA: Diagnosis not present

## 2024-01-25 ENCOUNTER — Ambulatory Visit (INDEPENDENT_AMBULATORY_CARE_PROVIDER_SITE_OTHER): Payer: Medicare HMO | Admitting: Family

## 2024-01-25 ENCOUNTER — Encounter: Payer: Self-pay | Admitting: Family

## 2024-01-25 VITALS — BP 133/66 | HR 72 | Temp 98.0°F | Resp 16 | Ht 66.0 in | Wt 165.6 lb

## 2024-01-25 DIAGNOSIS — Z1329 Encounter for screening for other suspected endocrine disorder: Secondary | ICD-10-CM

## 2024-01-25 DIAGNOSIS — Z13220 Encounter for screening for lipoid disorders: Secondary | ICD-10-CM | POA: Diagnosis not present

## 2024-01-25 DIAGNOSIS — Z13228 Encounter for screening for other metabolic disorders: Secondary | ICD-10-CM

## 2024-01-25 DIAGNOSIS — Z130 Encounter for screening for diseases of the blood and blood-forming organs and certain disorders involving the immune mechanism: Secondary | ICD-10-CM

## 2024-01-25 DIAGNOSIS — Z0000 Encounter for general adult medical examination without abnormal findings: Secondary | ICD-10-CM

## 2024-01-25 DIAGNOSIS — Z131 Encounter for screening for diabetes mellitus: Secondary | ICD-10-CM | POA: Diagnosis not present

## 2024-01-25 NOTE — Progress Notes (Signed)
 Patient ID: Andrew Hurst, male    DOB: September 03, 1946  MRN: [id number]  CC: Annual Exam  Subjective: Andrew Hurst is a 78 y.o. male who presents for annual exam.   His concerns today include:  - None.   Patient Active Problem List   Diagnosis Date Noted   Inflammatory disorder of digestive system 11/12/2022   Colon cancer screening 11/12/2022   Screening for malignant neoplasm of colon 11/12/2022   Family history of colon cancer 11/12/2022   Family history of malignant neoplasm of digestive organ 11/12/2022   Thrombosed external hemorrhoids 11/12/2022   Heart murmur 04/03/2019   Dyspnea on exertion 04/03/2019   Prediabetes 04/03/2019   History of colonic polyps 09/23/2017   Benign prostatic hyperplasia 07/17/2014   Hearing loss of left ear 07/17/2014   Hyperglycemia 07/17/2014   Elevated PSA, less than 10 ng/ml 10/29/2013     Current Outpatient Medications on File Prior to Visit  Medication Sig Dispense Refill   cyclobenzaprine  (FLEXERIL ) 5 MG tablet Take 1 tablet (5 mg total) by mouth 2 (two) times daily as needed for muscle spasms. 30 tablet 1   tamsulosin (FLOMAX) 0.4 MG CAPS capsule Take 0.4 mg by mouth. 3 times a week     amoxicillin -clavulanate (AUGMENTIN ) 875-125 MG tablet Take 1 tablet by mouth every 12 (twelve) hours. (Patient not taking: Reported on 06/20/2023) 14 tablet 0   No current facility-administered medications on file prior to visit.    No Known Allergies  Social History   Socioeconomic History   Marital status: Widowed    Spouse name: n/a   Number of children: 2   Years of education: 15   Highest education level: Not on file  Occupational History   Occupation: Training and development officer: [employer]  Tobacco Use   Smoking status: Never    Passive exposure: Never   Smokeless tobacco: Never  Vaping Use   Vaping status: Never Used  Substance and Sexual Activity   Alcohol use: No   Drug use: No   Sexual activity: Yes    Partners: Female     Birth control/protection: Condom    Comment: "not that much"  Other Topics Concern   Not on file  Social History Narrative   Lives alone. Widowed. Wife died in 68 of metastatic breast cancer at age 61 years. Education: college. Pt. Does exercise. One child in [place], [place]. One child in [place].   Social Drivers of Corporate investment banker Strain: Low Risk  (01/25/2024)   Overall Financial Resource Strain (CARDIA)    Difficulty of Paying Living Expenses: Not hard at all  Food Insecurity: No Food Insecurity (01/25/2024)   Hunger Vital Sign    Worried About Running Out of Food in the Last Year: Never true    Ran Out of Food in the Last Year: Never true  Transportation Needs: No Transportation Needs (01/25/2024)   PRAPARE - Administrator, Civil Service (Medical): No    Lack of Transportation (Non-Medical): No  Physical Activity: Unknown (01/25/2024)   Exercise Vital Sign    Days of Exercise per Week: 7 days    Minutes of Exercise per Session: Not on file  Stress: No Stress Concern Present (01/25/2024)   [employer] of Occupational Health - Occupational Stress Questionnaire    Feeling of Stress : Not at all  Social Connections: Moderately Isolated (01/25/2024)   Social Connection and Isolation Panel [NHANES]    Frequency of Communication  with Friends and Family: More than three times a week    Frequency of Social Gatherings with Friends and Family: More than three times a week    Attends Religious Services: More than 4 times per year    Active Member of [employer] or Organizations: No    Attends Banker Meetings: Never    Marital Status: Widowed  Intimate Partner Violence: Not At Risk (01/25/2024)   Humiliation, Afraid, Rape, and Kick questionnaire    Fear of Current or Ex-Partner: No    Emotionally Abused: No    Physically Abused: No    Sexually Abused: No    Family History  Problem Relation Age of Onset   Kidney disease Mother    COPD Father     Cancer Brother 104       colon cancer    Past Surgical History:  Procedure Laterality Date   HERNIA REPAIR      ROS: Review of Systems Negative except as stated above  PHYSICAL EXAM: BP 133/66   Pulse 72   Temp 98 F (36.7 C) (Oral)   Resp 16   Ht 5\' 6"  (1.676 m)   Wt 165 lb 9.6 oz (75.1 kg)   SpO2 97%   BMI 26.73 kg/m   Physical Exam HENT:     Head: Normocephalic and atraumatic.     Right Ear: Tympanic membrane, ear canal and external ear normal.     Left Ear: Tympanic membrane, ear canal and external ear normal.     Nose: Nose normal.     Mouth/Throat:     Mouth: Mucous membranes are moist.     Pharynx: Oropharynx is clear.  Eyes:     Extraocular Movements: Extraocular movements intact.     Conjunctiva/sclera: Conjunctivae normal.     Pupils: Pupils are equal, round, and reactive to light.  Neck:     Thyroid : No thyroid  mass, thyromegaly or thyroid  tenderness.  Cardiovascular:     Rate and Rhythm: Normal rate and regular rhythm.     Pulses: Normal pulses.     Heart sounds: Normal heart sounds.  Pulmonary:     Effort: Pulmonary effort is normal.     Breath sounds: Normal breath sounds.  Abdominal:     General: Bowel sounds are normal.     Palpations: Abdomen is soft.  Genitourinary:    Comments: Patient declined. Musculoskeletal:        General: Normal range of motion.     Right shoulder: Normal.     Left shoulder: Normal.     Right upper arm: Normal.     Left upper arm: Normal.     Right elbow: Normal.     Left elbow: Normal.     Right forearm: Normal.     Left forearm: Normal.     Right wrist: Normal.     Left wrist: Normal.     Right hand: Normal.     Left hand: Normal.     Cervical back: Normal, normal range of motion and neck supple.     Thoracic back: Normal.     Lumbar back: Normal.     Right hip: Normal.     Left hip: Normal.     Right upper leg: Normal.     Left upper leg: Normal.     Right knee: Normal.     Left knee: Normal.      Right lower leg: Normal.     Left lower leg: Normal.  Right ankle: Normal.     Left ankle: Normal.     Right foot: Normal.     Left foot: Normal.  Skin:    General: Skin is warm and dry.     Capillary Refill: Capillary refill takes less than 2 seconds.  Neurological:     General: No focal deficit present.     Mental Status: He is alert and oriented to person, place, and time.  Psychiatric:        Mood and Affect: Mood normal.        Behavior: Behavior normal.     ASSESSMENT AND PLAN: 1. Annual physical exam (Primary) - Counseled on 150 minutes of exercise per week as tolerated, healthy eating (including decreased daily intake of saturated fats, cholesterol, added sugars, sodium), STI prevention, and routine healthcare maintenance.  2. Screening for metabolic disorder - Routine screening.  - CMP14+EGFR  3. Screening for deficiency anemia - Routine screening.  - CBC  4. Diabetes mellitus screening - Routine screening.  - Hemoglobin A1c  5. Screening cholesterol level - Routine screening.  - Lipid panel  6. Thyroid  disorder screen - Routine screening.  - TSH   Patient was given the opportunity to ask questions.  Patient verbalized understanding of the plan and was able to repeat key elements of the plan. Patient was given clear instructions to go to Emergency Department or return to medical center if symptoms don't improve, worsen, or new problems develop.The patient verbalized understanding.   Orders Placed This Encounter  Procedures   CBC   Lipid panel   CMP14+EGFR   Hemoglobin A1c   TSH    Return in about 1 year (around 01/24/2025) for Physical per patient preference.  Senaida Dama, NP

## 2024-01-25 NOTE — Progress Notes (Signed)
 No concerns.

## 2024-01-26 ENCOUNTER — Other Ambulatory Visit: Payer: Self-pay | Admitting: Family

## 2024-01-26 DIAGNOSIS — Z130 Encounter for screening for diseases of the blood and blood-forming organs and certain disorders involving the immune mechanism: Secondary | ICD-10-CM

## 2024-01-26 LAB — CMP14+EGFR
ALT: 15 IU/L (ref 0–44)
AST: 17 IU/L (ref 0–40)
Albumin: 4.2 g/dL (ref 3.8–4.8)
Alkaline Phosphatase: 78 IU/L (ref 44–121)
BUN/Creatinine Ratio: 14 (ref 10–24)
BUN: 16 mg/dL (ref 8–27)
Bilirubin Total: 0.4 mg/dL (ref 0.0–1.2)
CO2: 23 mmol/L (ref 20–29)
Calcium: 8.6 mg/dL (ref 8.6–10.2)
Chloride: 105 mmol/L (ref 96–106)
Creatinine, Ser: 1.13 mg/dL (ref 0.76–1.27)
Globulin, Total: 1.5 g/dL (ref 1.5–4.5)
Glucose: 97 mg/dL (ref 70–99)
Potassium: 4 mmol/L (ref 3.5–5.2)
Sodium: 141 mmol/L (ref 134–144)
Total Protein: 5.7 g/dL — ABNORMAL LOW (ref 6.0–8.5)
eGFR: 66 mL/min/{1.73_m2} (ref >59)

## 2024-01-26 LAB — LIPID PANEL
Chol/HDL Ratio: 2.9 ratio (ref 0.0–5.0)
Cholesterol, Total: 129 mg/dL (ref 100–199)
HDL: 44 mg/dL (ref >39)
LDL Chol Calc (NIH): 64 mg/dL (ref 0–99)
Triglycerides: 114 mg/dL (ref 0–149)
VLDL Cholesterol Cal: 21 mg/dL (ref 5–40)

## 2024-01-26 LAB — HEMOGLOBIN A1C
Est. average glucose Bld gHb Est-mCnc: 117 mg/dL
Hgb A1c MFr Bld: 5.7 % — ABNORMAL HIGH (ref 4.8–5.6)

## 2024-01-26 LAB — CBC
Hematocrit: 38.1 % (ref 37.5–51.0)
Hemoglobin: 12.6 g/dL — ABNORMAL LOW (ref 13.0–17.7)
MCH: 32.1 pg (ref 26.6–33.0)
MCHC: 33.1 g/dL (ref 31.5–35.7)
MCV: 97 fL (ref 79–97)
Platelets: 207 10*3/uL (ref 150–450)
RBC: 3.92 x10E6/uL — ABNORMAL LOW (ref 4.14–5.80)
RDW: 12.2 % (ref 11.6–15.4)
WBC: 7.1 10*3/uL (ref 3.4–10.8)

## 2024-01-26 LAB — TSH: TSH: 3.4 u[IU]/mL (ref 0.450–4.500)

## 2024-01-27 ENCOUNTER — Telehealth: Payer: Self-pay | Admitting: Family

## 2024-01-27 NOTE — Telephone Encounter (Signed)
 Patient came in for lab results and Traundra CMA gave him results and gave him print off of labs as requested from patient. Patient stated he understood.

## 2024-02-01 DIAGNOSIS — K080 Exfoliation of teeth due to systemic causes: Secondary | ICD-10-CM | POA: Diagnosis not present

## 2024-02-18 DIAGNOSIS — H40033 Anatomical narrow angle, bilateral: Secondary | ICD-10-CM | POA: Diagnosis not present

## 2024-02-18 DIAGNOSIS — H2513 Age-related nuclear cataract, bilateral: Secondary | ICD-10-CM | POA: Diagnosis not present

## 2024-03-09 ENCOUNTER — Ambulatory Visit: Payer: Self-pay

## 2024-03-27 ENCOUNTER — Ambulatory Visit (INDEPENDENT_AMBULATORY_CARE_PROVIDER_SITE_OTHER)

## 2024-04-20 ENCOUNTER — Ambulatory Visit

## 2024-04-20 ENCOUNTER — Ambulatory Visit (INDEPENDENT_AMBULATORY_CARE_PROVIDER_SITE_OTHER)

## 2024-04-20 DIAGNOSIS — Z130 Encounter for screening for diseases of the blood and blood-forming organs and certain disorders involving the immune mechanism: Secondary | ICD-10-CM | POA: Diagnosis not present

## 2024-04-21 LAB — CBC
Hematocrit: 39 % (ref 37.5–51.0)
Hemoglobin: 12.7 g/dL — ABNORMAL LOW (ref 13.0–17.7)
MCH: 32.2 pg (ref 26.6–33.0)
MCHC: 32.6 g/dL (ref 31.5–35.7)
MCV: 99 fL — ABNORMAL HIGH (ref 79–97)
Platelets: 190 x10E3/uL (ref 150–450)
RBC: 3.94 x10E6/uL — ABNORMAL LOW (ref 4.14–5.80)
RDW: 12.4 % (ref 11.6–15.4)
WBC: 6.8 x10E3/uL (ref 3.4–10.8)

## 2024-04-23 ENCOUNTER — Ambulatory Visit: Payer: Self-pay | Admitting: Family

## 2024-08-07 DIAGNOSIS — R972 Elevated prostate specific antigen [PSA]: Secondary | ICD-10-CM | POA: Diagnosis not present

## 2024-08-07 DIAGNOSIS — N400 Enlarged prostate without lower urinary tract symptoms: Secondary | ICD-10-CM | POA: Diagnosis not present

## 2024-08-07 DIAGNOSIS — N401 Enlarged prostate with lower urinary tract symptoms: Secondary | ICD-10-CM | POA: Diagnosis not present

## 2024-08-07 DIAGNOSIS — N138 Other obstructive and reflux uropathy: Secondary | ICD-10-CM | POA: Diagnosis not present

## 2024-08-08 DIAGNOSIS — K080 Exfoliation of teeth due to systemic causes: Secondary | ICD-10-CM | POA: Diagnosis not present

## 2025-01-29 ENCOUNTER — Encounter: Admitting: Family
